# Patient Record
Sex: Female | Born: 1960 | Race: Black or African American | Hispanic: No | Marital: Married | Smoking: Never smoker
Health system: Southern US, Community
[De-identification: ages and names within clinical notes are randomized; demographics above are authoritative.]

## PROBLEM LIST (undated history)

## (undated) DIAGNOSIS — N63 Unspecified lump in unspecified breast: Secondary | ICD-10-CM

## (undated) DIAGNOSIS — L709 Acne, unspecified: Secondary | ICD-10-CM

## (undated) DIAGNOSIS — L0291 Cutaneous abscess, unspecified: Secondary | ICD-10-CM

## (undated) DIAGNOSIS — D219 Benign neoplasm of connective and other soft tissue, unspecified: Secondary | ICD-10-CM

## (undated) DIAGNOSIS — M25471 Effusion, right ankle: Secondary | ICD-10-CM

## (undated) DIAGNOSIS — Z87440 Personal history of urinary (tract) infections: Secondary | ICD-10-CM

## (undated) DIAGNOSIS — D250 Submucous leiomyoma of uterus: Secondary | ICD-10-CM

## (undated) DIAGNOSIS — R011 Cardiac murmur, unspecified: Secondary | ICD-10-CM

## (undated) DIAGNOSIS — D649 Anemia, unspecified: Secondary | ICD-10-CM

## (undated) DIAGNOSIS — Z8742 Personal history of other diseases of the female genital tract: Secondary | ICD-10-CM

## (undated) DIAGNOSIS — M2550 Pain in unspecified joint: Secondary | ICD-10-CM

## (undated) DIAGNOSIS — N611 Abscess of the breast and nipple: Secondary | ICD-10-CM

## (undated) DIAGNOSIS — M25475 Effusion, left foot: Secondary | ICD-10-CM

## (undated) DIAGNOSIS — E669 Obesity, unspecified: Secondary | ICD-10-CM

## (undated) DIAGNOSIS — M25474 Effusion, right foot: Secondary | ICD-10-CM

## (undated) DIAGNOSIS — K5900 Constipation, unspecified: Secondary | ICD-10-CM

## (undated) DIAGNOSIS — L0390 Cellulitis, unspecified: Secondary | ICD-10-CM

## (undated) HISTORY — DX: Obesity, unspecified: E66.9

## (undated) HISTORY — DX: Cellulitis, unspecified: L03.90

## (undated) HISTORY — DX: Personal history of other diseases of the female genital tract: Z87.42

## (undated) HISTORY — DX: Effusion, right foot: M25.474

## (undated) HISTORY — DX: Cardiac murmur, unspecified: R01.1

## (undated) HISTORY — DX: Pain in unspecified joint: M25.50

## (undated) HISTORY — DX: Acne, unspecified: L70.9

## (undated) HISTORY — DX: Cutaneous abscess, unspecified: L02.91

## (undated) HISTORY — DX: Unspecified lump in unspecified breast: N63.0

## (undated) HISTORY — DX: Personal history of urinary (tract) infections: Z87.440

## (undated) HISTORY — DX: Effusion, left foot: M25.475

## (undated) HISTORY — DX: Anemia, unspecified: D64.9

## (undated) HISTORY — DX: Constipation, unspecified: K59.00

## (undated) HISTORY — PX: ABDOMINAL HYSTERECTOMY: SHX81

## (undated) HISTORY — DX: Abscess of the breast and nipple: N61.1

## (undated) HISTORY — DX: Effusion, right ankle: M25.471

## (undated) HISTORY — DX: Submucous leiomyoma of uterus: D25.0

## (undated) HISTORY — PX: TUBAL LIGATION: SHX77

## (undated) HISTORY — DX: Benign neoplasm of connective and other soft tissue, unspecified: D21.9

---

## 1997-12-02 ENCOUNTER — Ambulatory Visit (HOSPITAL_COMMUNITY): Admission: RE | Admit: 1997-12-02 | Discharge: 1997-12-02 | Payer: Self-pay | Admitting: Obstetrics and Gynecology

## 1998-02-11 ENCOUNTER — Other Ambulatory Visit: Admission: RE | Admit: 1998-02-11 | Discharge: 1998-02-11 | Payer: Self-pay | Admitting: Obstetrics and Gynecology

## 1998-03-03 ENCOUNTER — Other Ambulatory Visit: Admission: RE | Admit: 1998-03-03 | Discharge: 1998-03-03 | Payer: Self-pay | Admitting: Obstetrics & Gynecology

## 1998-03-31 ENCOUNTER — Other Ambulatory Visit: Admission: RE | Admit: 1998-03-31 | Discharge: 1998-03-31 | Payer: Self-pay | Admitting: Obstetrics and Gynecology

## 1998-03-31 ENCOUNTER — Inpatient Hospital Stay (HOSPITAL_COMMUNITY): Admission: AD | Admit: 1998-03-31 | Discharge: 1998-04-04 | Payer: Self-pay | Admitting: Obstetrics and Gynecology

## 1998-04-05 ENCOUNTER — Encounter (HOSPITAL_COMMUNITY): Admission: RE | Admit: 1998-04-05 | Discharge: 1998-07-04 | Payer: Self-pay | Admitting: Obstetrics and Gynecology

## 1998-07-16 ENCOUNTER — Encounter (HOSPITAL_COMMUNITY): Admission: RE | Admit: 1998-07-16 | Discharge: 1998-10-14 | Payer: Self-pay | Admitting: *Deleted

## 1998-10-22 ENCOUNTER — Other Ambulatory Visit: Admission: RE | Admit: 1998-10-22 | Discharge: 1998-10-22 | Payer: Self-pay | Admitting: Obstetrics and Gynecology

## 1999-12-13 ENCOUNTER — Other Ambulatory Visit: Admission: RE | Admit: 1999-12-13 | Discharge: 1999-12-13 | Payer: Self-pay | Admitting: Obstetrics and Gynecology

## 2000-12-13 ENCOUNTER — Other Ambulatory Visit: Admission: RE | Admit: 2000-12-13 | Discharge: 2000-12-13 | Payer: Self-pay | Admitting: Obstetrics and Gynecology

## 2001-10-17 DIAGNOSIS — L0390 Cellulitis, unspecified: Secondary | ICD-10-CM

## 2001-10-17 HISTORY — DX: Cellulitis, unspecified: L03.90

## 2001-12-21 ENCOUNTER — Encounter: Payer: Self-pay | Admitting: Obstetrics and Gynecology

## 2001-12-21 ENCOUNTER — Ambulatory Visit (HOSPITAL_COMMUNITY): Admission: RE | Admit: 2001-12-21 | Discharge: 2001-12-21 | Payer: Self-pay | Admitting: Obstetrics and Gynecology

## 2002-01-02 ENCOUNTER — Other Ambulatory Visit: Admission: RE | Admit: 2002-01-02 | Discharge: 2002-01-02 | Payer: Self-pay | Admitting: Obstetrics and Gynecology

## 2002-05-10 ENCOUNTER — Inpatient Hospital Stay (HOSPITAL_COMMUNITY): Admission: AD | Admit: 2002-05-10 | Discharge: 2002-05-13 | Payer: Self-pay | Admitting: Obstetrics and Gynecology

## 2004-07-27 ENCOUNTER — Other Ambulatory Visit: Admission: RE | Admit: 2004-07-27 | Discharge: 2004-07-27 | Payer: Self-pay | Admitting: Obstetrics and Gynecology

## 2004-09-03 ENCOUNTER — Ambulatory Visit (HOSPITAL_COMMUNITY): Admission: RE | Admit: 2004-09-03 | Discharge: 2004-09-03 | Payer: Self-pay | Admitting: Obstetrics and Gynecology

## 2005-09-22 ENCOUNTER — Other Ambulatory Visit: Admission: RE | Admit: 2005-09-22 | Discharge: 2005-09-22 | Payer: Self-pay | Admitting: Obstetrics and Gynecology

## 2005-10-17 DIAGNOSIS — N611 Abscess of the breast and nipple: Secondary | ICD-10-CM

## 2005-10-17 DIAGNOSIS — L0291 Cutaneous abscess, unspecified: Secondary | ICD-10-CM

## 2005-10-17 HISTORY — DX: Abscess of the breast and nipple: N61.1

## 2005-10-17 HISTORY — DX: Cutaneous abscess, unspecified: L02.91

## 2006-04-10 ENCOUNTER — Ambulatory Visit (HOSPITAL_COMMUNITY): Admission: RE | Admit: 2006-04-10 | Discharge: 2006-04-10 | Payer: Self-pay | Admitting: Obstetrics and Gynecology

## 2006-04-21 ENCOUNTER — Encounter: Admission: RE | Admit: 2006-04-21 | Discharge: 2006-04-21 | Payer: Self-pay | Admitting: Obstetrics and Gynecology

## 2006-08-25 ENCOUNTER — Encounter: Admission: RE | Admit: 2006-08-25 | Discharge: 2006-08-25 | Payer: Self-pay | Admitting: Obstetrics and Gynecology

## 2006-08-28 ENCOUNTER — Encounter: Admission: RE | Admit: 2006-08-28 | Discharge: 2006-08-28 | Payer: Self-pay | Admitting: Obstetrics and Gynecology

## 2006-10-17 DIAGNOSIS — Z8742 Personal history of other diseases of the female genital tract: Secondary | ICD-10-CM

## 2006-10-17 DIAGNOSIS — D250 Submucous leiomyoma of uterus: Secondary | ICD-10-CM

## 2006-10-17 HISTORY — DX: Personal history of other diseases of the female genital tract: Z87.42

## 2006-10-17 HISTORY — DX: Submucous leiomyoma of uterus: D25.0

## 2007-08-22 ENCOUNTER — Encounter (INDEPENDENT_AMBULATORY_CARE_PROVIDER_SITE_OTHER): Payer: Self-pay | Admitting: Obstetrics and Gynecology

## 2007-08-22 ENCOUNTER — Ambulatory Visit (HOSPITAL_COMMUNITY): Admission: RE | Admit: 2007-08-22 | Discharge: 2007-08-22 | Payer: Self-pay | Admitting: Obstetrics and Gynecology

## 2007-11-16 ENCOUNTER — Ambulatory Visit (HOSPITAL_COMMUNITY): Admission: RE | Admit: 2007-11-16 | Discharge: 2007-11-16 | Payer: Self-pay | Admitting: Obstetrics and Gynecology

## 2008-10-17 DIAGNOSIS — E669 Obesity, unspecified: Secondary | ICD-10-CM

## 2008-10-17 HISTORY — DX: Obesity, unspecified: E66.9

## 2008-12-02 ENCOUNTER — Ambulatory Visit (HOSPITAL_BASED_OUTPATIENT_CLINIC_OR_DEPARTMENT_OTHER): Admission: RE | Admit: 2008-12-02 | Discharge: 2008-12-02 | Payer: Self-pay | Admitting: Obstetrics and Gynecology

## 2008-12-02 ENCOUNTER — Ambulatory Visit: Payer: Self-pay | Admitting: Diagnostic Radiology

## 2008-12-10 ENCOUNTER — Encounter: Admission: RE | Admit: 2008-12-10 | Discharge: 2008-12-10 | Payer: Self-pay | Admitting: Obstetrics and Gynecology

## 2009-04-08 ENCOUNTER — Inpatient Hospital Stay (HOSPITAL_COMMUNITY): Admission: RE | Admit: 2009-04-08 | Discharge: 2009-04-10 | Payer: Self-pay | Admitting: Obstetrics and Gynecology

## 2009-04-08 ENCOUNTER — Encounter (INDEPENDENT_AMBULATORY_CARE_PROVIDER_SITE_OTHER): Payer: Self-pay | Admitting: Obstetrics and Gynecology

## 2010-04-05 ENCOUNTER — Ambulatory Visit: Payer: Self-pay | Admitting: Diagnostic Radiology

## 2010-04-05 ENCOUNTER — Ambulatory Visit (HOSPITAL_BASED_OUTPATIENT_CLINIC_OR_DEPARTMENT_OTHER): Admission: RE | Admit: 2010-04-05 | Discharge: 2010-04-05 | Payer: Self-pay | Admitting: Obstetrics and Gynecology

## 2010-11-07 ENCOUNTER — Encounter: Payer: Self-pay | Admitting: Obstetrics and Gynecology

## 2011-01-24 LAB — CBC
MCHC: 33.6 g/dL (ref 30.0–36.0)
MCHC: 33.6 g/dL (ref 30.0–36.0)
MCV: 85.1 fL (ref 78.0–100.0)
MCV: 85.7 fL (ref 78.0–100.0)
RBC: 3.21 MIL/uL — ABNORMAL LOW (ref 3.87–5.11)
RBC: 4.02 MIL/uL (ref 3.87–5.11)
RDW: 15.9 % — ABNORMAL HIGH (ref 11.5–15.5)
WBC: 13.7 10*3/uL — ABNORMAL HIGH (ref 4.0–10.5)
WBC: 6.4 10*3/uL (ref 4.0–10.5)

## 2011-03-01 NOTE — Op Note (Signed)
NAMEABIGAIL, TEALL                 ACCOUNT NO.:  [id number]   MEDICAL RECORD NO.:  [id number]          PATIENT TYPE:  AMB   LOCATION:  SDC                           FACILITY:  [facility]   PHYSICIAN:  Hal Morales, M.D.DATE OF BIRTH:  04-Jun-1961   DATE OF PROCEDURE:  08/22/2007  DATE OF DISCHARGE:                               OPERATIVE REPORT   PREOPERATIVE DIAGNOSES:  Menorrhagia, uterine fibroids, anemia.   POSTOPERATIVE DIAGNOSES:  Menorrhagia, uterine fibroids, anemia.   PROCEDURE:  Hysteroscopy, uterine fibroid resection, endometrial  ablation using NovaSure.   SURGEON:  Hal Morales, M.D.   ANESTHESIA:  General, LMA.   COMPLICATIONS:  None.   FINDINGS:  The uterus contained a 2-3 cm fibroid emanating from the  anterior endometrial cavity.  The remainder of the endometrium appeared  thin.  The tubal ostia were within normal limits.   PROCEDURE:  The patient was taken to the operating room after  appropriate identification and placed on the operating table.  After the  attainment of adequate general anesthesia, she was placed in the  lithotomy position.  The perineum and vagina were prepped with multiple  layers of Betadine and a straight catheter used to empty the bladder.  The perineum was draped as a sterile field.  A Graves speculum was  placed in the vagina and a paracervical block achieved with a total of  10 mL of 2% Xylocaine in the 5 and 7 o'clock positions.  The single-  tooth tenaculum was placed on the anterior cervix and the cervical  length measured at 3.5 cm.  The uterus was measured at 9 cm.  The cervix  was dilated to a #33-French to accommodate the operative hysteroscope.  The hysteroscope was placed in the endometrial cavity and the above-  noted findings made and documented.  The VersaPoint apparatus was used  to remove a sample of the fibroid for pathologic diagnosis.  The  VersaPoint roll bar was then attached to the operating channel of the  hysteroscope and was used to successively vaporize the entire uterine  fibroid until the uterine cavity could be completely visualized.  At  that point the hysteroscope was removed and the endometrial cavity  sharply curetted in all quadrants.  The NovaSure endometrial ablation  apparatus was then set at 5.5 and placed into the endometrial cavity.  The array was then opened and seated for a maximum cavity width of 4.5  cm.  The collar of the NovaSure was then pushed toward the cervix and  Vaseline gauze used to completely seal the cervical opening.  The test  for cavity integrity was undertaken and passed.  The NovaSure ablation  was then begun at a power of 136.  The procedure was completed in 53  seconds.  The the Novasure array was then replaced in the carrier and  the carrier removed from the endometrial cavity.  All instruments were  then removed from the vagina and a suture of 2-0 chromic used to achieve  hemostasis in the cervix at the site of the tenaculum placement.  Hemostasis was then noted  to be adequate and the patient was awakened  from general anesthesia and taken to the  recovery room in satisfactory condition, having tolerated the procedure  well, with sponge and instrument counts correct.   SPECIMENS TO PATHOLOGY:  Portion of fibroid and endometrial curettings.      Hal Morales, M.D.  Electronically Signed     VPH/MEDQ  D:  08/22/2007  T:  08/23/2007  Job:  [id number]

## 2011-03-01 NOTE — H&P (Signed)
NAMEAARINI, [name]                 ACCOUNT NO.:  [id number]   MEDICAL RECORD NO.:  [id number]          PATIENT TYPE:  AMB   LOCATION:  SDC                           [facility]ACILITY:  [facility]   PHYSICIAN:  Hal Morales, M.D.DATE OF BIRTH:  Mar 08, 1961   DATE OF ADMISSION:  08/22/2007  DATE OF DISCHARGE:                              HISTORY & PHYSICAL   HISTORY OF THE PRESENT ILLNESS:  The patient is a 50 year old black  married female, para 2-0-0-2, who presents for management of uterine  fibroids, menorrhagia, and subsequent anemia.  The patient had her usual  regular periods __________ heavy until May of 2008 at which time she  noted that her menses were as close together as every 2 weeks and could  last as much as 10 days.  She underwent a TSH which was within normal  limits.  She then underwent a sonohysterogram which showed uterine  fibroids with a submucosal fibroid measuring 2.5 x 2.4.  There also  seemed to be appearance of a hyperechoic mass suggestive of a polyp  which is about 2.3 cm.  At that time, options for management included  hysterectomy and hysteroscopic resection of the submucosal lesions with  or without endometrial ablation.  The patient opted to undergo  hysteroscopy, fibroid resection, and endometrial ablation.   PAST MEDICAL HISTORY:  Negative.   OBSTETRICAL HISTORY:  The patient had a cesarean section in 1999 at 36  weeks' gestation complicated by preeclampsia.  She had a cesarean  section in 2003 with tubal ligation at the same time.  Her postoperative  course was complicated by incisional cellulitis which was managed with  antibiotics.   SURGICAL HISTORY:  As above.   FAMILY HISTORY:  Positive for hypertension and diabetes.   CURRENT MEDICATIONS:  None.   DRUG SENSITIVITIES:  NO KNOWN DRUG ALLERGIES.   REVIEW OF SYSTEMS:  Negative except for the aforementioned changes in  menstrual bleeding.  The patient specifically denied syncope or  dizziness and had  no orthostatic changes in her pulse or blood pressure.   PHYSICAL EXAMINATION:  The patient is a well-developed, black female in  no acute distress.  Temperature 98.7.  Blood pressure 140/88.  Weight  170 pounds.  LUNGS:  Clear.  HEART:  Regular rate and rhythm.  ABDOMEN:  Soft without masses or organomegaly.  PELVIC:  __________ within normal limits.  The vagina is __________ .  The cervix is __________ .  The uterus is upper limits of normal size,  mobile, and nontender.  Adnexa, no masses.   IMPRESSION:  1. Menorrhagia.  2. Uterine fibroids which appear submucosal on sonohysterogram with a      suggestion of another submucosal intracavitary lesion.  3. History of anemia, now on iron.   DISPOSITION:  The patient is to undergo hysteroscopic resection of the  intracavitary masses at [facility] as an outpatient.  She likewise  wishes to proceed with endometrial ablation.  The risks of anesthesia,  bleeding, infection, damage to adjacent organs, and uterine perforation  were all explained to the patient and she seems to  understand.  She  wishes to proceed.  This is to be done at [facility] on August 22, 2007.      Hal Morales, M.D.  Electronically Signed     VPH/MEDQ  D:  08/22/2007  T:  08/23/2007  Job:  [id number]

## 2011-03-01 NOTE — H&P (Signed)
NAMECATHARINE, [name]          ACCOUNT NO.:  [id number]   MEDICAL RECORD NO.:  [id number]           PATIENT TYPE:  INP   LOCATION:  9303                          [facility]ACILITY:  [facility]   PHYSICIAN:  Hal Morales, M.D.DATE OF BIRTH:  May 23, 1961   DATE OF ADMISSION:  04/08/2009  DATE OF DISCHARGE:                              HISTORY & PHYSICAL   HISTORY OF PRESENT ILLNESS:  [name] is a 50 year old married  African American female para 1-1-0-2 presenting for a laparoscopically-  assisted vaginal hysterectomy because of symptomatic uterine fibroids,  menorrhagia, and anemia.  For the past 2 years, the patient reports  heavy menstrual periods, which could occur as often as every 2 weeks and  last for 2 days.  During these episodes she would change an overnight  pad approximately 6 times a day due to the fact she had severe clotting  and occasionally would soil her clothing.  The patient had menstrual  cramps, which she rated as an 8/10 on a 10-point pain scale.  However,  it would totally resolve with the dosing of 2 tablets of Aleve.  The  patient denied any changes in her bowel movements, urinary tract  symptoms, dyspareunia, fever, vaginitis symptoms, or intermenstrual  bleeding.  A pelvic ultrasound and sonohysterogram done in 2008, showed  a uterus measuring 8.58 x 5.70 x 6.06 cm, any submucosal fibroid  measuring 2.58 x 2.4 cm, polyps measuring 2.39 x 0.98 cm.  Additionally,  the patient had an anterior pedunculated fibroid on the lower uterine  segment measuring 3.99 x 3.47 x 3.68 cm and anterior lower uterine  segment fibroid measuring 2.77 x 1.82 x 2.40 cm.  Subsequent to these  studies in November 2008, the patient underwent hysteroscopic fibroid  resection followed by endometrial ablation using the NovaSure system.  In the 13 months that followed the patient states that her menstrual  flow only lasted for 7 days with 1 day being heavy; however, not as  heavy as her  previous menses had been.  In December 2009, the patient's  hemoglobin was 11.  For the past 6 months, the patient states that her  menstrual flow has once again become very heavy lasted for approximately  10 days and requiring the change of an overnight pad (with clotting) 6  times per day.  Given the dramatic recurrence and disruptive nature of  her menstrual flow, the patient has decided to proceed with definitive  therapy for management in the form of hysterectomy.   PAST MEDICAL HISTORY:  OB history gravida 2, para 1-1-0-2, the patient  underwent cesarean section on two occasions.  GYN history, menarche 50  years old.  Last menstrual period, Mar 14, 2009.  The patient uses  bilateral tubal ligation as a method of contraception.  Denies any  history of abnormal Pap smear or sexually transmitted diseases.  Her  last normal Pap smear was December 2009.   MEDICAL HISTORY:  Positive for anemia, chest wall abscess,  pyelonephritis, vitamin D deficiency (resolved May 2009), and cystic  acne.   SURGICAL HISTORY:  In 2003, bilateral tubal ligation; 2008,  hysteroscopic resection  of submucosal fibroid; and NovaSure endometrial  ablation.  The patient denies any problems with anesthesia or history of  blood transfusion.   FAMILY HISTORY:  Positive for hypertension, diabetes, brain cancer.   HABITS:  The patient does not use tobacco, alcohol, illicit drug use.   SOCIAL HISTORY:  The patient is married and she works as a Advertising account executive.   MEDICATIONS:  Septra DS 1 tablet twice daily, iron 65 mg daily,  multivitamin daily, vitamin E daily.   ALLERGIES:  The patient has no known drug allergies.  Denies any  sensitivities to latex or shellfish.   REVIEW OF SYSTEMS:  The patient denies any chest pain, shortness of  breath, vision changes, nausea, vomiting, diarrhea, recent weight loss,  night sweats, pelvic pain, fever, and except as is mentioned in history  of present  illness the patient's review of systems is otherwise  negative.   PHYSICAL EXAMINATION:  VITAL SIGNS:  Blood pressure 140/80, pulse was  82, respirations of 15, temperature of 98.6 degrees Fahrenheit orally.  Weight 174 pounds, height 5 feet 2 inches tall.  Body mass index 31.8.  GENERAL:  Neck is supple without masses.  There is no thyromegaly or  cervical adenopathy.  HEART:  Regular rate and rhythm.  LUNGS:  Clear.  BACK:  No CVA tenderness.  ABDOMEN:  No tenderness, masses, or organomegaly.  EXTREMITIES:  No clubbing, cyanosis, or edema.  PELVIC:  EGBUS was within normal limits.  Vagina was rugous.  Cervix was  nontender without lesions.  Uterus sound to 9 cm.  The uterus itself was  8-10 weeks' size and irregular without any tenderness though note that  the patient's exam was limited by body habitus and the patient's  anxiety.  Adnexa was without tenderness or masses.  Rectovaginal without  masses.  The patient's endometrial biopsy (April 02, 2009) revealed  benign early secretory endometrium consistent with postovulatory days 2-  3.  No hyperplasia, atypia, or malignancy identified.   IMPRESSION:  1. Menorrhagia.  2. Symptomatic uterine fibroids.  3. Anemia.   DISPOSITION:  A discussion was held with the patient regarding the  indications for her procedures along with its risks, which include but  are not limited to reaction to anesthesia, damage to adjacent organs,  infection, excessive bleeding, and the possibility that an open  abdominal incision may be necessary to complete her surgery safely.  Additionally, the patient was advised that her ovaries will remain  unless they appear diseased at which time they will be removed.  The  patient verbalized understanding of these risks and has consented to  proceed with laparoscopically-assisted vaginal hysterectomy with the  possibility of a total abdominal hysterectomy at [facility] of  [place] on April 08, 2009, at 11  o'clock a.m.      [name].      Hal Morales, M.D.  Electronically Signed    EJP/MEDQ  D:  04/06/2009  T:  04/07/2009  Job:  [id number]

## 2011-03-01 NOTE — H&P (Signed)
NAMEELVENA, OYER                 ACCOUNT NO.:  [id number]   MEDICAL RECORD NO.:  [id number]          PATIENT TYPE:  AMB   LOCATION:  SDC                           [facility]   PHYSICIAN:  Hal Morales, M.D.DATE OF BIRTH:  07/21/1961   DATE OF ADMISSION:  DATE OF DISCHARGE:                              HISTORY & PHYSICAL   HISTORY OF THE PRESENT ILLNESS:  The patient is a 50 year old black  married female, para 2-0-0-2, who presents for management of menorrhagia  and uterine fibroids.  The patient had a long history of heavy menses,  however, in May of this year, began having menses as much as every 2  weeks, bleeding as much as 10 days in succession.  She remained  hemodynamically stable; however, had documented anemia to a hemoglobin  of 9 while taking iron.  She underwent a TSH, which was within normal  limits.  She then underwent a pelvic ultrasound and sonohysterogram,  which showed evidence of a submucosal fibroid measuring 2.5 x 2.4, as  well as a hyperechoic mass suggestive of an endometrial polyp measuring  approximately 2.4 cm.  The patient presents for management of this.   PAST MEDICAL HISTORY:  Essentially negative.   OBSTETRICAL HISTORY:  The patient has had 2   DICTATION ENDED AT THIS POINT.      Hal Morales, M.D.  Electronically Signed     VPH/MEDQ  D:  08/22/2007  T:  08/22/2007  Job:  [id number]

## 2011-03-01 NOTE — Op Note (Signed)
NAMEMAXINE, FREDMAN                 ACCOUNT NO.:  [id number]   MEDICAL RECORD NO.:  [id number]          PATIENT TYPE:  INP   LOCATION:  9303                          [facility]ACILITY[facility]   PHYSICIAN:  Hal Morales, M.D.DATE OF BIRTH:  1961-01-03   DATE OF PROCEDURE:  04/08/2009  DATE OF DISCHARGE:                               OPERATIVE REPORT   PREOPERATIVE DIAGNOSES:  1. Symptomatic uterine fibroids.  2. Menometrorrhagia.  3. Anemia.   POSTOPERATIVE DIAGNOSES:  1. Symptomatic uterine fibroids.  2. Menometrorrhagia.  3. Anemia.  4. Endometriosis.  5. Pelvic adhesions.   PROCEDURE:  Laparoscopy, total abdominal hysterectomy with lysis of  adhesions.   SURGEON:  Vanessa P. Pennie Rushing, MD   FIRST ASSISTANT:  Elmira J. Lowell Guitar, PA   ANESTHESIA:  General orotracheal.   ESTIMATED BLOOD LOSS:  800 mL.   COMPLICATIONS:  None.   FINDINGS:  The uterus was enlarged with multiple myomata.  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  The left  ovary was densely adherent to the posterior uterus with stigmata of  powder-burn lesions.  The right ovary was within normal limits.  The  tubes were status post interruption for tubal sterilization.  The  bladder flap status post cesarean section was densely adherent to the  anterior uterus.   PROCEDURE:  The patient was taken to the operating room after  appropriate identification and placed on the operating table.  After the  attainment of adequate general anesthesia, she was placed in the  modified lithotomy position.  The abdomen, perineum, and vagina were all  prepped with multiple layers of Betadine.  A Hulka tenaculum was placed  on the anterior cervix.  A Foley catheter was inserted into the bladder  and connected to straight drainage.  The abdomen and perineum were  draped as a sterile field.  Subumbilical and suprapubic injections of  0.25% Marcaine for a total of 10 mL was undertaken.  Subumbilical  incision was made, and a Veress cannula placed through that incision  into the peritoneal cavity.  Pneumoperitoneum was created with 3 L of  CO2.  The Veress cannula was removed, and the laparoscopic trocar placed  through that incision into the peritoneal cavity.  The laparoscope was  placed in the trocar sleeve.  Suprapubic incisions were made to the  right and left of midline, and laparoscopic probe and trocars of 5 mm  were placed through those incisions into the peritoneal cavity under  direct visualization.  The above-noted findings were made and  documented.  A decision was made to proceed with abdominal hysterectomy.  The instruments were all removed from the peritoneal cavity under direct  visualization as the CO2 was allowed to escape.  The subumbilical  incision was closed with a fascial suture of 0 Vicryl in a figure-of-  eight pattern.  The skin incision was closed with a subcuticular suture  of 3-0 Vicryl.  The suprapubic incisions were incorporated into the skin  incision for the hysterectomy.  The abdomen was opened in layers.  The  peritoneum was  entered, and a self-retaining O'Connor-O'Sullivan  retractor placed in the incision.  The bowel was packed cephalad.  The  uterus was grasped at each cornual region with a Kelly clamp and  elevated to the extent allowed given the degree of adhesive disease.  The right round ligament was then suture ligated and incised and that  incision taken on the anterior leaf of the broad ligament.  It was also  taken caudad to meet the utero-ovarian ligament which was clamped, cut,  tied with free tie and suture ligated.  The left round ligament was  suture ligated and incised and that incision taken anteriorly on the  anterior leaf of the broad ligament which overlay the retroperitoneal  fibroids.  The incision in the broad ligament was taken toward the  bladder and met the incision from the other side.  The bladder was  sharply dissected  off the anterior cervix.  The left utero-ovarian  ligament was densely adherent to the posterior uterus as was the left  ovary.  A combination of blunt and sharp dissection were used to free  the ovary on that side such that the utero-ovarian ligament could be  clamped, cut, tied with a free tie and suture ligated.  The fallopian  tube that overlay the left ovary was then clamped, cut, and excised with  a suture ligature of the base.  A similar procedure was carried out with  the tube that was adherent to the right ovary.  The tubes were removed  from the operative field.  The left uterine fibroid was then dissected  from its retroperitoneal position with a combination of blunt and sharp  dissection, and upon elevation of that fibroid the left uterine artery  was visualized.  It was clamped, cut, and suture ligated.  The uterine  artery on the right was likewise clamped, cut, and suture ligated.  The  parametrial wall and paracervical tissues on the right and left were  successively clamped, cut, and suture ligated.  The uterosacral  ligaments were clamped, cut, and suture ligated with those sutures being  held.  The vaginal angles were then clamped, clamped, cut, and suture  ligated with those sutures held.  The remainder of the uterus and cervix  were excised from the upper vagina and removed from the operative field.  The vaginal cuff was closed with a figure-of-eight suture.  All sutures  to this point were 0 Vicryl.  The left pelvic sidewall had areas of  oozing where the left retroperitoneal fibroid had been dissected.  The  ureter on that side was identified and noted to have overlying  peritoneum with powder-burn lesion.  The bleeders on that side were  incorporated in a single suture from the peritoneum closest to the  ureter to the peritoneum that had previously overlying the round  ligament.  By closing this space and with pressure, hemostasis was  achieved in that area.   Copious irrigation was then carried out, and  hemostasis noted to be adequate.  All instruments were then removed from  the peritoneal cavity, and the abdominal peritoneum closed with a  running suture of 2-0 Vicryl.  The rectus muscles were made hemostatic  with Bovie cautery and irrigated.  The rectus fascia was closed from  each apex to the midline and tied in the midline with a running suture  of 0 Vicryl.  The subcutaneous tissue was made hemostatic with Bovie  cautery and irrigated.  Skin staples were applied to the skin  incision.  A dressing was applied, and the patient was awakened from general  anesthesia, then taken to the recovery room in satisfactory condition  having tolerated the procedure well with sponge and instrument counts  correct.   SPECIMENS TO PATHOLOGY:  Uterus, cervix, right and left fallopian tubes.      Hal Morales, M.D.  Electronically Signed     VPH/MEDQ  D:  04/08/2009  T:  04/09/2009  Job:  [id number]

## 2011-03-04 NOTE — Discharge Summary (Signed)
[name]                 ACCOUNT NO.:  [id number]   MEDICAL RECORD NO.:  [id number]          PATIENT TYPE:  INP   LOCATION:  9303                          FACILITY:  [facility]   PHYSICIAN:  Hal Morales, M.D.DATE OF BIRTH:  [birth year]   DATE OF ADMISSION:  04/08/2009  DATE OF DISCHARGE:  04/10/2009                               DISCHARGE SUMMARY   DISCHARGE DIAGNOSES:  Symptomatic uterine fibroids, menometrorrhagia,  anemia, endometriosis, and pelvic adhesions.   OPERATION ON THE DAY OF ADMISSION:  The patient underwent a diagnostic  laparoscopy followed by a total abdominal hysterectomy with bilateral  salpingectomy and lysis of adhesions.  The patient was found to have a  uterus, which was enlarged with multiple myomata.  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  The left ovary was  densely adherent to the posterior uterus, it was stigmata, and powder  burn lesions.  The right ovary was within normal limits.  The tubes were  status post interruption for tubal sterilization.  The bladder flap,  status post cesarean section, was densely adherent to the anterior  uterus.   HISTORY OF PRESENT ILLNESS:  [name] is a 50 year old married  African American female, para 1-1-0-2, who presented for definitive  therapy of symptomatic uterine fibroids, menorrhagia, and anemia.  Please see the patient's dictated history and physical examination for  details.   PREOPERATIVE PHYSICAL EXAMINATION:  VITA SIGNS:  Blood pressure was  140/80, pulse was 82, respirations 15, temperature 98.6 degrees  Fahrenheit orally, weight 174 pounds, height 5 feet 2 inches, and body  mass index 31.8.  GENERAL:  Within normal limits.  PELVIC:  EGBUS was within normal limits.  Vagina was rugous.  Cervix was  nontender without lesions.  Uterus was 8-10 weeks' size and irregular  without any tenderness though it was noted that the patient's exam was  limited by body  habitus and the patient's anxiety.  The patient's adnexa  was without tenderness or masses and rectovaginal was without tenderness  or masses.   HOSPITAL COURSE ON THE DAY OF ADMISSION:  The patient underwent  aforementioned procedures, tolerating them all well.  Postoperative  course was unremarkable with the patient having minimal symptoms to her  postop hemoglobin of 9.3 (preop hemoglobin 11.5).  By postop day #2, the  patient had resumed bowel and bladder function, was not orthostatic, and  was therefore deemed ready for discharge home.   DISCHARGE MEDICATIONS:  1. Colace 100 mg twice daily until bowel movements are regular.  2. Percocet 5/325 1-2 tablets every 4 hours as needed for pain.  3. Ibuprofen 600 mg with food every 6 hours for 5 days, then as needed      for pain.   FOLLOWUP:  The patient has an appointment at [facility] for  staple removal on April 16, 2009 at 2:45 p.m.  She also has a 6 weeks  postoperative visit with Dr. Pennie Rushing on May 20, 2009 at 10:30 a.m.   DISCHARGE INSTRUCTIONS:  The patient was given a copy of  [facility] postoperative instruction sheet.  She was further  advised to avoid driving for 2 weeks, heavy lifting for 6 weeks,  intercourse for 6 weeks, then she may walk up stairs, and she may  shower.  The patient's diet was without restriction.  Wound care is to  keep incision clean and dry.   FINAL PATHOLOGY:  Uterus and bilateral fallopian tubes, hysterectomy and  bilateral salpingectomy:  Leiomyoma; endometrium - benign secretory  endometrium, no atypia, hyperplasia, or malignancy identified.  Cervix -  benign squamous mucosa and endocervix.  No dysplasia or malignancy;  bilateral fallopian tubes:  No pathologic abnormalities.      [name].      Hal Morales, M.D.  Electronically Signed    EJP/MEDQ  D:  04/25/2009  T:  04/26/2009  Job:  [id number]

## 2011-07-26 LAB — CBC
Platelets: 311
RBC: 4.24
WBC: 7.5

## 2012-01-11 ENCOUNTER — Ambulatory Visit: Payer: Self-pay | Admitting: Obstetrics and Gynecology

## 2012-03-27 ENCOUNTER — Ambulatory Visit: Payer: Self-pay | Admitting: Obstetrics and Gynecology

## 2012-05-21 ENCOUNTER — Ambulatory Visit: Payer: Self-pay | Admitting: Obstetrics and Gynecology

## 2012-05-24 ENCOUNTER — Ambulatory Visit: Payer: Self-pay | Admitting: Obstetrics and Gynecology

## 2012-05-24 ENCOUNTER — Encounter: Payer: Self-pay | Admitting: Obstetrics and Gynecology

## 2012-05-24 ENCOUNTER — Ambulatory Visit (INDEPENDENT_AMBULATORY_CARE_PROVIDER_SITE_OTHER): Payer: BC Managed Care – PPO | Admitting: Obstetrics and Gynecology

## 2012-05-24 VITALS — BP 142/72 | Ht 63.0 in | Wt 181.0 lb

## 2012-05-24 DIAGNOSIS — Z8742 Personal history of other diseases of the female genital tract: Secondary | ICD-10-CM

## 2012-05-24 DIAGNOSIS — L0390 Cellulitis, unspecified: Secondary | ICD-10-CM

## 2012-05-24 DIAGNOSIS — Z87440 Personal history of urinary (tract) infections: Secondary | ICD-10-CM | POA: Insufficient documentation

## 2012-05-24 DIAGNOSIS — L0291 Cutaneous abscess, unspecified: Secondary | ICD-10-CM

## 2012-05-24 DIAGNOSIS — E669 Obesity, unspecified: Secondary | ICD-10-CM

## 2012-05-24 DIAGNOSIS — N63 Unspecified lump in unspecified breast: Secondary | ICD-10-CM | POA: Insufficient documentation

## 2012-05-24 DIAGNOSIS — D250 Submucous leiomyoma of uterus: Secondary | ICD-10-CM | POA: Insufficient documentation

## 2012-05-24 DIAGNOSIS — D649 Anemia, unspecified: Secondary | ICD-10-CM

## 2012-05-24 DIAGNOSIS — N611 Abscess of the breast and nipple: Secondary | ICD-10-CM | POA: Insufficient documentation

## 2012-05-24 DIAGNOSIS — D219 Benign neoplasm of connective and other soft tissue, unspecified: Secondary | ICD-10-CM | POA: Insufficient documentation

## 2012-05-24 LAB — HEMOGLOBIN: Hemoglobin: 12.7 g/dL (ref 12.0–15.0)

## 2012-05-24 NOTE — Progress Notes (Signed)
AEX  Last Pap: 10/08/2008 WNL: Yes Regular Periods:no Contraception: Hysterectomy  Monthly Breast exam:yes Tetanus<33yrs: unsure Nl.Bladder Function:yes Daily BMs:yes Healthy Diet:yes Calcium:no Mammogram:yes Date of Mammogram: 03/2011 Exercise:yes Have often Exercise: 1-2 times weekly Seatbelt: yes Abuse at home: no Stressful work:no Sigmoid-colonoscopy: 2006 Bone Density: No PCP: None Change in PMH: None Change in [facility]: None Subjective:    [name] is a 51 y.o. female [id number] who presents for annual exam.  The patient has no complaints today. She is s/p hsyterectomy. Now caring for her mom with early dementia age 46  The following portions of the patient's history were reviewed and updated as appropriate: allergies, current medications, past family history, past medical history, past social history, past surgical history and problem list.  Review of Systems Pertinent items are noted in HPI. Gastrointestinal:No change in bowel habits, no abdominal pain, no rectal bleeding Genitourinary:negative for dysuria, frequency, hematuria, nocturia and urinary incontinence    Objective:     BP 142/72  Ht 5\' 3"  (1.6 m)  Wt 181 lb (82.101 kg)  BMI 32.06 kg/m2  Weight:  Wt Readings from Last 1 Encounters:  05/24/12 181 lb (82.101 kg)     BMI: Body mass index is 32.06 kg/(m^2). General Appearance: Alert, appropriate appearance for age. No acute distress HEENT: Grossly normal Neck / Thyroid: Supple, no masses, nodes or enlargement Lungs: clear to auscultation bilaterally Back: No CVA tenderness Breast Exam: No masses or nodes.No dimpling, nipple retraction or discharge. Cardiovascular: Regular rate and rhythm. S1, S2, no murmur Gastrointestinal: Soft, non-tender, no masses or organomegaly Pelvic Exam: External genitalia: normal general appearance Vaginal: normal mucosa without prolapse or lesions and vaginal vault, well suspended and well healed Cervix: removed  surgically Adnexa: normal bimanual exam Uterus: removed surgically Rectovaginal: normal rectal, no masses Lymphatic Exam: Non-palpable nodes in neck, clavicular, axillary, or inguinal regions  Skin: no rash or abnormalities Neurologic: Normal gait and speech, no tremor  Psychiatric: Alert and oriented, appropriate affect.    Urinalysis:Not done      Assessment:    normal post hysterectomy exam    Plan:    Discussed healthy lifestyle modifications.  Follow-up:  for annual exam

## 2012-07-10 ENCOUNTER — Telehealth: Payer: Self-pay

## 2012-07-10 NOTE — Telephone Encounter (Signed)
The requested letter with the wording as outlined is approved.

## 2012-07-10 NOTE — Telephone Encounter (Signed)
Tc from pt. Pt wants vph to know that she been under the care of [facility] due to "superficial vein discomfort in legs". Pt is moving forth with tx by injection. Pt needs vph to write a letter for ins purposes stating,"Dr. Pennie Rushing is aware of this assessment(as her PCP per pt) and aware pt is under the care of [facility] in order to proceed with tx. Informed pt to have [facility] fax [place] recent office notes rgdg this. Will consult with vph rgdg letter after review of records. Pt voices understanding.

## 2012-07-11 NOTE — Telephone Encounter (Signed)
Letter faxed to Dr. Ardyth Gal per pt's req [phone]. Pt agrees.

## 2013-10-01 ENCOUNTER — Ambulatory Visit: Payer: Self-pay | Admitting: Dietician

## 2013-11-13 ENCOUNTER — Encounter: Payer: Self-pay | Admitting: *Deleted

## 2013-11-13 ENCOUNTER — Encounter: Payer: BC Managed Care – PPO | Attending: Obstetrics and Gynecology | Admitting: *Deleted

## 2013-11-13 DIAGNOSIS — Z8249 Family history of ischemic heart disease and other diseases of the circulatory system: Secondary | ICD-10-CM | POA: Insufficient documentation

## 2013-11-13 DIAGNOSIS — Z833 Family history of diabetes mellitus: Secondary | ICD-10-CM | POA: Insufficient documentation

## 2013-11-13 DIAGNOSIS — E669 Obesity, unspecified: Secondary | ICD-10-CM

## 2013-11-13 DIAGNOSIS — Z713 Dietary counseling and surveillance: Secondary | ICD-10-CM | POA: Insufficient documentation

## 2013-11-13 NOTE — Progress Notes (Signed)
Medical Nutrition Therapy:  Appt start time: 1700 end time:  1800.  Assessment:  Primary concerns today: [name] is here for nutrition counseling.  She would like to lose a little weight and prevent HTN, hyperlipidemia, diabetes, etc.  She would like to learn more about healthy eating and how to reduce sugar in her diet.  There is a strong family history of DM, HTN, etc and she wants to prevent that.  She has been working with a health couch monthly to help keep her encouraged and accountable.  She lives with her husband, mother, and 2 sons.  She does the food shopping and shares responsibility of cooking with her husband.  She reports mostly baking foods.  She eats out pretty often: 3 times a week: [employer] salad or chili.  She is trying to be more health conscious for the past 3-6 months.  Sometimes they get [place].  She eats in the kitchen table or in the family room with tv trays while watching tv.  She states she finishes a meal in 10-15 minutes.   Her highest weight as an adult is 178 lb and lowest weight as an adult is 126 lb when in college.  Her most consistent weight is around 145-160.After her second pregnancy she was not able to lose the baby weight and her weight crept up due to lac of activity and unhealthy eating choices.  She would like to weigh around 145 lb  Preferred Learning Style:   Visual    Learning Readiness:   Change in progress   MEDICATIONS: see list   DIETARY INTAKE:  Usual eating pattern includes 2-3 meals and 0-2 snacks per day.  Everyday foods include proteins, vegetables, some starches.  Avoided foods include none.    24-hr recall:  Snk ( AM): yogurt around 9:30 with granola and fruit and sometimes peanuts~ parfait  L ( PM): peanut butter crackers and peaches with water.  Usually salad with ham or [place] or chicken with white cheese, broccoli, carrots, with iceberg lettuce.  May or may not have crutons.  Tries to get [place] dressing and she might get french  dressing.  water Snk ( PM): chocolate bar bite size sometimes  D ( PM): salad with ham or [place] or chicken with white cheese, broccoli, carrots, with iceberg lettuce.  May or may not have crutons.  Tries to get [place] dressing and she might get french dressing.  Baked chicken or fish Snk ( PM): maybe cookie Beverages: water.  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  As evidenced by dietary recall.    Intervention:  Nutrition counseling provided.  Encouraged patient to reject traditional diet mentality of "good" vs "bad" foods.  There are no good and bad foods, but rather food is fuel that we needs for our bodies.  When we don't get enough fuel, our bodies suffer the metabolic consequences.  Encouraged patient to eat whatever foods will satisfy them, regardless of their nutritional value.  We will discuss nutritional values of foods at a subsequent appointment.  Encouraged patient to honor their body's internal hunger and fullness cues.  Throughout the day, check in mentally and rate hunger.  Try not to eat when ravenous, but instead  when slightly hungry.  Then choose food(s) that will be satisfying regardless of nutritional content.  Sit down to enjoy those foods.  Minimize distractions: turn off tv, put away books, work, Brewing technologist.  Make the meal last at least 20 minutes in order to give time to experience and register satiety.  Stop eating when full regardless of how much food is left on the plate.  Get more if still hungry.  The key is to honor fullness so throughout the meal, rate fullness factor and stop when comfortably full, but not stuffed.  Reminded patient that they can have any food they want, whenever they want, and  however much they want.  Eventually the novelty will wear out and each food will be equal in terms of its emotional appeal.  This will be a learning process and some days more food will be eaten, some days less.  The key is to honor hunger and fullness without any feelings of guilt.  Pay attention to what the internal cues are, rather than any external factors.  Goals: B: yogurt with fruit, nuts, and granola; peanut butter toast/English muffin with fruit; oatmeal with fruit; bagel thin; egg muffin with toast and fruit L: salad with low-sodium cracker; hot meal D: salad with crutons or cracker; meat, starch, more veggie :-)  Teaching Method Utilized:  Visual Auditory  Handouts given during visit include:  MyPlate  snacks  Barriers to learning/adherence to lifestyle change: busy schedule  Demonstrated degree of understanding via:  Teach Back   Monitoring/Evaluation:  Dietary intake, exercise, and body weight prn.

## 2013-11-13 NOTE — Patient Instructions (Signed)
B: yogurt with fruit, nuts, and granola; peanut butter toast/English muffin with fruit; oatmeal with fruit; bagel thin; egg muffin with toast and fruit L: salad with low-sodium cracker; hot meal D: salad with crutons or cracker; meat, starch, more veggie :-)

## 2014-08-18 ENCOUNTER — Encounter: Payer: Self-pay | Admitting: *Deleted

## 2016-05-04 DIAGNOSIS — I8393 Asymptomatic varicose veins of bilateral lower extremities: Secondary | ICD-10-CM | POA: Insufficient documentation

## 2016-05-04 DIAGNOSIS — L700 Acne vulgaris: Secondary | ICD-10-CM | POA: Insufficient documentation

## 2016-05-04 DIAGNOSIS — M722 Plantar fascial fibromatosis: Secondary | ICD-10-CM | POA: Insufficient documentation

## 2016-05-04 DIAGNOSIS — I10 Essential (primary) hypertension: Secondary | ICD-10-CM | POA: Insufficient documentation

## 2016-05-25 DIAGNOSIS — R748 Abnormal levels of other serum enzymes: Secondary | ICD-10-CM | POA: Insufficient documentation

## 2018-03-16 DIAGNOSIS — E559 Vitamin D deficiency, unspecified: Secondary | ICD-10-CM | POA: Insufficient documentation

## 2018-03-16 DIAGNOSIS — N951 Menopausal and female climacteric states: Secondary | ICD-10-CM | POA: Insufficient documentation

## 2019-04-30 ENCOUNTER — Other Ambulatory Visit: Payer: Self-pay | Admitting: *Deleted

## 2019-04-30 DIAGNOSIS — Z20822 Contact with and (suspected) exposure to covid-19: Secondary | ICD-10-CM

## 2019-05-05 LAB — NOVEL CORONAVIRUS, NAA: SARS-CoV-2, NAA: NOT DETECTED

## 2019-09-10 ENCOUNTER — Encounter (INDEPENDENT_AMBULATORY_CARE_PROVIDER_SITE_OTHER): Payer: Self-pay | Admitting: Family Medicine

## 2019-09-10 ENCOUNTER — Ambulatory Visit (INDEPENDENT_AMBULATORY_CARE_PROVIDER_SITE_OTHER): Payer: BC Managed Care – PPO | Admitting: Family Medicine

## 2019-09-10 ENCOUNTER — Other Ambulatory Visit: Payer: Self-pay

## 2019-09-10 VITALS — BP 148/76 | HR 66 | Temp 98.2°F | Ht 62.0 in | Wt 184.0 lb

## 2019-09-10 DIAGNOSIS — Z0289 Encounter for other administrative examinations: Secondary | ICD-10-CM

## 2019-09-10 DIAGNOSIS — Z9189 Other specified personal risk factors, not elsewhere classified: Secondary | ICD-10-CM

## 2019-09-10 DIAGNOSIS — G4710 Hypersomnia, unspecified: Secondary | ICD-10-CM

## 2019-09-10 DIAGNOSIS — F3289 Other specified depressive episodes: Secondary | ICD-10-CM | POA: Diagnosis not present

## 2019-09-10 DIAGNOSIS — R5383 Other fatigue: Secondary | ICD-10-CM | POA: Diagnosis not present

## 2019-09-10 DIAGNOSIS — R0602 Shortness of breath: Secondary | ICD-10-CM

## 2019-09-10 DIAGNOSIS — E559 Vitamin D deficiency, unspecified: Secondary | ICD-10-CM

## 2019-09-10 DIAGNOSIS — E669 Obesity, unspecified: Secondary | ICD-10-CM

## 2019-09-10 DIAGNOSIS — I10 Essential (primary) hypertension: Secondary | ICD-10-CM | POA: Diagnosis not present

## 2019-09-10 DIAGNOSIS — Z6833 Body mass index (BMI) 33.0-33.9, adult: Secondary | ICD-10-CM

## 2019-09-10 DIAGNOSIS — L732 Hidradenitis suppurativa: Secondary | ICD-10-CM | POA: Insufficient documentation

## 2019-09-11 LAB — COMPREHENSIVE METABOLIC PANEL
ALT: 13 IU/L (ref 0–32)
AST: 18 IU/L (ref 0–40)
Albumin/Globulin Ratio: 1.7 (ref 1.2–2.2)
Albumin: 4.3 g/dL (ref 3.8–4.9)
Alkaline Phosphatase: 101 IU/L (ref 39–117)
BUN/Creatinine Ratio: 20 (ref 9–23)
BUN: 18 mg/dL (ref 6–24)
Bilirubin Total: 0.2 mg/dL (ref 0.0–1.2)
CO2: 25 mmol/L (ref 20–29)
Calcium: 9.1 mg/dL (ref 8.7–10.2)
Chloride: 104 mmol/L (ref 96–106)
Creatinine, Ser: 0.91 mg/dL (ref 0.57–1.00)
GFR calc Af Amer: 81 mL/min/{1.73_m2} (ref >59)
GFR calc non Af Amer: 70 mL/min/{1.73_m2} (ref >59)
Globulin, Total: 2.6 g/dL (ref 1.5–4.5)
Glucose: 85 mg/dL (ref 65–99)
Potassium: 4.2 mmol/L (ref 3.5–5.2)
Sodium: 141 mmol/L (ref 134–144)
Total Protein: 6.9 g/dL (ref 6.0–8.5)

## 2019-09-11 LAB — LIPID PANEL WITH LDL/HDL RATIO
Cholesterol, Total: 184 mg/dL (ref 100–199)
HDL: 47 mg/dL (ref >39)
LDL Chol Calc (NIH): 121 mg/dL — ABNORMAL HIGH (ref 0–99)
LDL/HDL Ratio: 2.6 ratio (ref 0.0–3.2)
Triglycerides: 88 mg/dL (ref 0–149)
VLDL Cholesterol Cal: 16 mg/dL (ref 5–40)

## 2019-09-11 LAB — CBC WITH DIFFERENTIAL/PLATELET
Basophils Absolute: 0 10*3/uL (ref 0.0–0.2)
Basos: 0 %
EOS (ABSOLUTE): 0.1 10*3/uL (ref 0.0–0.4)
Eos: 1 %
Hematocrit: 37.2 % (ref 34.0–46.6)
Hemoglobin: 12.1 g/dL (ref 11.1–15.9)
Immature Grans (Abs): 0 10*3/uL (ref 0.0–0.1)
Immature Granulocytes: 0 %
Lymphocytes Absolute: 3 10*3/uL (ref 0.7–3.1)
Lymphs: 40 %
MCH: 27.5 pg (ref 26.6–33.0)
MCHC: 32.5 g/dL (ref 31.5–35.7)
MCV: 85 fL (ref 79–97)
Monocytes Absolute: 0.3 10*3/uL (ref 0.1–0.9)
Monocytes: 4 %
Neutrophils Absolute: 4.1 10*3/uL (ref 1.4–7.0)
Neutrophils: 55 %
Platelets: 283 10*3/uL (ref 150–450)
RBC: 4.4 x10E6/uL (ref 3.77–5.28)
RDW: 13.3 % (ref 11.7–15.4)
WBC: 7.5 10*3/uL (ref 3.4–10.8)

## 2019-09-11 LAB — INSULIN, RANDOM: INSULIN: 7.2 u[IU]/mL (ref 2.6–24.9)

## 2019-09-11 LAB — VITAMIN D 25 HYDROXY (VIT D DEFICIENCY, FRACTURES): Vit D, 25-Hydroxy: 38.5 ng/mL (ref 30.0–100.0)

## 2019-09-11 LAB — VITAMIN B12: Vitamin B-12: 1037 pg/mL (ref 232–1245)

## 2019-09-11 LAB — T3: T3, Total: 125 ng/dL (ref 71–180)

## 2019-09-11 LAB — HEMOGLOBIN A1C
Est. average glucose Bld gHb Est-mCnc: 126 mg/dL
Hgb A1c MFr Bld: 6 % — ABNORMAL HIGH (ref 4.8–5.6)

## 2019-09-11 LAB — T4, FREE: Free T4: 1.17 ng/dL (ref 0.82–1.77)

## 2019-09-11 LAB — TSH: TSH: 1.43 u[IU]/mL (ref 0.450–4.500)

## 2019-09-11 NOTE — Progress Notes (Signed)
Office: [phone]  /  Fax: [phone]   HPI:   Chief Complaint: OBESITY  CHINIQUA BLEA (MR# [id number]) is a 58 y.o. female who presents on 09/10/2019 for obesity evaluation and treatment. Current BMI is Body mass index is 33.65 kg/m. Diesha has struggled with obesity for years and has been unsuccessful in either losing weight or maintaining long term weight loss. Kimley states she is currently in the action stage of change and ready to dedicate time achieving and maintaining a healthier weight.   Clarissa states her family eats meals together she thinks her family will eat healthier with  her she struggles with family and or coworkers weight loss sabotage her desired weight loss is 39 lbs or less she started gaining weight after age 52 and hysterectomy her heaviest weight ever was 186 lbs she has significant food cravings issues  she skips meals frequently she is frequently drinking liquids with calories she frequently makes poor food choices she frequently eats larger portions than normal  she struggles with emotional eating    Fatigue Gae feels her energy is lower than it should be. This has worsened with weight gain and has not worsened recently. Queenie admits to daytime somnolence and  denies waking up still tired. Patient is at risk for obstructive sleep apnea. Patent has a history of symptoms of daytime fatigue. Patient generally gets 6 hours of sleep per night, and states they generally have generally restful sleep. Snoring is present. Apneic episodes are not present. Epworth Sleepiness Score is 12.  Dyspnea on exertion Gavin notes increasing shortness of breath with exercising and seems to be worsening over time with weight gain. She notes getting out of breath sooner with activity than she used to. This has not gotten worse recently. Ranie denies orthopnea.  Hypersomnia Daryan has hypersomnia. Her epworth score is 12. She states she wakes up refreshed, but notes occasional  snoring.  Hypertension Arika GENEE FIALKOWSKI is a 58 y.o. female with hypertension. Jossilyn stopped spironolactone 2 weeks ago. She denies chest pain. She is working on weight loss to help control her blood pressure with the goal of decreasing her risk of heart attack and stroke.   Vitamin D Deficiency Cabrini has a diagnosis of vitamin D deficiency. She is currently taking multivitamin gummies daily and denies nausea, vomiting or muscle weakness.  At risk for osteopenia and osteoporosis Bruchy is at higher risk of osteopenia and osteoporosis due to vitamin D deficiency.   Depression with Emotional Eating Behaviors Nnenna is struggling with emotional eating and using food for comfort to the extent that it is negatively impacting her health. She often snacks when she is not hungry. Kayly sometimes feels she is out of control and then feels guilty that she made poor food choices. She has been working on behavior modification techniques to help reduce her emotional eating and has been somewhat successful. She shows no sign of suicidal or homicidal ideations.  Depression Screen Catelin's Food and Mood (modified PHQ-9) score was  Depression screen PHQ 2/9 09/10/2019  Decreased Interest 1  Down, Depressed, Hopeless 1  PHQ - 2 Score 2  Altered sleeping 1  Tired, decreased energy 1  Change in appetite 1  Feeling bad or failure about yourself  0  Trouble concentrating 1  Moving slowly or fidgety/restless 0  Suicidal thoughts 0  PHQ-9 Score 6  Difficult doing work/chores Not difficult at all    ASSESSMENT AND PLAN:  Other fatigue - Plan: EKG 12-Lead, Comprehensive metabolic  panel, CBC with Differential/Platelet, Hemoglobin A1c, Insulin, random, B12, T3, T4, free, TSH  SOB (shortness of breath) on exertion - Plan: Lipid Panel With LDL/HDL Ratio  Hypersomnia  Essential hypertension  Vitamin D deficiency - Plan: Vitamin D (25 hydroxy)  Other depression, with emotional eating   At risk for  osteoporosis  Class 1 obesity with serious comorbidity and body mass index (BMI) of 33.0 to 33.9 in adult, unspecified obesity type  PLAN:  Fatigue Ayaan was informed that her fatigue may be related to obesity, depression or many other causes. Labs will be ordered, and in the meanwhile Oprah has agreed to work on diet, exercise and weight loss to help with fatigue. Proper sleep hygiene was discussed including the need for 7-8 hours of quality sleep each night. A sleep study was not ordered based on symptoms and Epworth score.  Dyspnea on exertion Heavenly's shortness of breath appears to be obesity related and exercise induced. She has agreed to work on weight loss and gradually increase exercise to treat her exercise induced shortness of breath. If Alyanah follows our instructions and loses weight without improvement of her shortness of breath, we will plan to refer to pulmonology. We will monitor this condition regularly. Laya agrees to this plan.  Hypersomnia We will check labs today, and will continue to monitor.  Hypertension We discussed sodium restriction, working on healthy weight loss, and a regular exercise program as the means to achieve improved blood pressure control. Jahnae agreed with this plan and agreed to follow up as directed. We will continue to monitor her blood pressure as well as her progress with the above lifestyle modifications. Willy agrees to restart spironolactone as prescribed, no refill needed. She will watch for signs of hypotension as she continues her lifestyle modifications. Aaliayah agrees to follow up with our clinic in 2 weeks.  Vitamin D Deficiency Delainee was informed that low vitamin D levels contributes to fatigue and are associated with obesity, breast, and colon cancer. She will follow up for routine testing of vitamin D, at least 2-3 times per year. She was informed of the risk of over-replacement of vitamin D and agrees to not increase her dose unless she  discusses this with [place] first. We will check labs today. Edmund agrees to follow up with our clinic in 2 weeks.  At risk for osteopenia and osteoporosis Addilyne was given extended (15 minutes) osteoporosis prevention counseling today. Mayrani is at risk for osteopenia and osteoporsis due to her vitamin D deficiency. She was encouraged to take her vitamin D and follow her higher calcium diet and increase strengthening exercise to help strengthen her bones and decrease her risk of osteopenia and osteoporosis.  Depression with Emotional Eating Behaviors We discussed behavior modification techniques today to help Minelly deal with her emotional eating and depression. We will continue to monitor.  Depression Screen Aeron had a mildly positive depression screening. Depression is commonly associated with obesity and often results in emotional eating behaviors. We will monitor this closely and work on CBT to help improve the non-hunger eating patterns. Referral to Psychology may be required if no improvement is seen as she continues in our clinic.  Obesity Daizee is currently in the action stage of change and her goal is to continue with weight loss efforts She has agreed to follow the Category 2 plan Laia has been instructed to work up to a goal of 150 minutes of combined cardio and strengthening exercise per week or as tolerated for weight  loss and overall health benefits. We discussed the following Behavioral Modification Strategies today: increasing lean protein intake, decreasing simple carbohydrates, increasing vegetables, increase H20 intake, work on meal planning and easy cooking plans and holiday eating strategies   Makailyn has agreed to follow up with our clinic in 2 weeks. She was informed of the importance of frequent follow up visits to maximize her success with intensive lifestyle modifications for her multiple health conditions. She was informed we would discuss her lab results at her next visit  unless there is a critical issue that needs to be addressed sooner. Bryann agreed to keep her next visit at the agreed upon time to discuss these results.  ALLERGIES: No Known Allergies  MEDICATIONS: Current Outpatient Medications on File Prior to Visit  Medication Sig Dispense Refill  . Biotin w/ Vitamins C & E (HAIR/SKIN/NAILS PO) Take by mouth.    . Multiple Vitamins-Minerals (MULTIVITAMIN PO) Take by mouth.    . terbinafine (LAMISIL) 250 MG tablet Take 250 mg by mouth daily.    Marland Kitchen spironolactone (ALDACTONE) 50 MG tablet Take 50 mg by mouth daily.     No current facility-administered medications on file prior to visit.     PAST MEDICAL HISTORY: Past Medical History:  Diagnosis Date  . Abscess of skin 2007  . Acne   . Anemia   . Bilateral swelling of feet and ankles   . Breast abscess 2007  . Breast mass   . Cellulitis 2003  . Constipation   . Fibroid   . Fibroids, submucosal 2008  . H/O: menorrhagia 2008  . Heart murmur   . Hx: UTI (urinary tract infection)   . Joint pain   . Obesity 2010    PAST SURGICAL HISTORY: Past Surgical History:  Procedure Laterality Date  . ABDOMINAL HYSTERECTOMY    . TUBAL LIGATION      SOCIAL HISTORY: Social History   Tobacco Use  . Smoking status: Never Smoker  . Smokeless tobacco: Never Used  Substance Use Topics  . Alcohol use: No  . Drug use: No    FAMILY HISTORY: Family History  Problem Relation Age of Onset  . Hypertension Mother   . Diabetes Mother   . Diabetes Maternal Grandmother   . Hypertension Maternal Grandmother   . Diabetes Maternal Grandfather   . Hypertension Maternal Grandfather   . Cancer Father   . Depression Father     ROS: Review of Systems  Constitutional: Positive for malaise/fatigue. Negative for weight loss.  Eyes:       + Vision changes + Wear glasses or contacts  Respiratory: Positive for shortness of breath (with exertion).   Cardiovascular: Negative for chest pain and orthopnea.    Gastrointestinal: Negative for nausea and vomiting.  Musculoskeletal:       Negative muscle weakness  Skin:       + Dryness (scalp)  Psychiatric/Behavioral: Positive for depression. Negative for suicidal ideas.       + Stress    PHYSICAL EXAM: Blood pressure (!) 148/76, pulse 66, temperature 98.2 F (36.8 C), temperature source Oral, height 5\' 2"  (1.575 m), weight 184 lb (83.5 kg), SpO2 99 %. Body mass index is 33.65 kg/m. Physical Exam Vitals signs reviewed.  Constitutional:      Appearance: Normal appearance. She is obese.  HENT:     Head: Normocephalic and atraumatic.     Nose: Nose normal.  Eyes:     General: No scleral icterus.    Extraocular Movements: Extraocular  movements intact.  Neck:     Musculoskeletal: Normal range of motion and neck supple.     Comments: No thyromegaly present Cardiovascular:     Rate and Rhythm: Normal rate and regular rhythm.     Pulses: Normal pulses.     Heart sounds: Normal heart sounds.  Pulmonary:     Effort: No respiratory distress.     Breath sounds: Normal breath sounds.  Abdominal:     Palpations: Abdomen is soft.     Tenderness: There is no abdominal tenderness.     Comments: + Obesity  Musculoskeletal: Normal range of motion.     Right lower leg: No edema.     Left lower leg: No edema.  Skin:    General: Skin is warm and dry.  Neurological:     Mental Status: She is alert and oriented to person, place, and time.     Coordination: Coordination normal.  Psychiatric:        Mood and Affect: Mood normal.        Behavior: Behavior normal.     RECENT LABS AND TESTS: BMET    Component Value Date/Time   NA 141 09/10/2019 1040   K 4.2 09/10/2019 1040   CL 104 09/10/2019 1040   CO2 25 09/10/2019 1040   GLUCOSE 85 09/10/2019 1040   BUN 18 09/10/2019 1040   CREATININE 0.91 09/10/2019 1040   CALCIUM 9.1 09/10/2019 1040   GFRNONAA 70 09/10/2019 1040   GFRAA 81 09/10/2019 1040   Lab Results  Component Value Date    HGBA1C 6.0 (H) 09/10/2019   Lab Results  Component Value Date   INSULIN 7.2 09/10/2019   CBC    Component Value Date/Time   WBC 7.5 09/10/2019 1040   WBC 13.7 (H) 04/09/2009 0522   RBC 4.40 09/10/2019 1040   RBC 3.21 (L) 04/09/2009 0522   HGB 12.1 09/10/2019 1040   HCT 37.2 09/10/2019 1040   PLT 283 09/10/2019 1040   MCV 85 09/10/2019 1040   MCH 27.5 09/10/2019 1040   MCHC 32.5 09/10/2019 1040   MCHC 33.6 04/09/2009 0522   RDW 13.3 09/10/2019 1040   LYMPHSABS 3.0 09/10/2019 1040   EOSABS 0.1 09/10/2019 1040   BASOSABS 0.0 09/10/2019 1040   Iron/TIBC/Ferritin/ %Sat No results found for: IRON, TIBC, FERRITIN, IRONPCTSAT Lipid Panel     Component Value Date/Time   CHOL 184 09/10/2019 1040   TRIG 88 09/10/2019 1040   HDL 47 09/10/2019 1040   LDLCALC 121 (H) 09/10/2019 1040   Hepatic Function Panel     Component Value Date/Time   PROT 6.9 09/10/2019 1040   ALBUMIN 4.3 09/10/2019 1040   AST 18 09/10/2019 1040   ALT 13 09/10/2019 1040   ALKPHOS 101 09/10/2019 1040   BILITOT 0.2 09/10/2019 1040      Component Value Date/Time   TSH 1.430 09/10/2019 1040   Vitamin D No recent labs  ECG  shows NSR with a rate of 62 BPM INDIRECT CALORIMETER done today shows a VO2 of 260 and a REE of 1810. Her calculated basal metabolic rate is 99991111 thus her basal metabolic rate is better than expected.       OBESITY BEHAVIORAL INTERVENTION VISIT  Today's visit was # 1   Starting weight: 184 lbs Starting date: 09/10/2019 Today's weight : 184 lbs  Today's date: 09/10/2019 Total lbs lost to date: 0    ASK: We discussed the diagnosis of obesity with Marius Ditch today and Alvis Lemmings  agreed to give [place] permission to discuss obesity behavioral modification therapy today.  ASSESS: Tamaira has the diagnosis of obesity and her BMI today is 33.65 Aurelie is in the action stage of change   ADVISE: Raschelle was educated on the multiple health risks of obesity as well as the benefit of  weight loss to improve her health. She was advised of the need for long term treatment and the importance of lifestyle modifications to improve her current health and to decrease her risk of future health problems.  AGREE: Multiple dietary modification options and treatment options were discussed and  Mikiala agreed to follow the recommendations documented in the above note.  ARRANGE: Ramsi was educated on the importance of frequent visits to treat obesity as outlined per CMS and USPSTF guidelines and agreed to schedule her next follow up appointment today.   Wilhemena Durie, am acting as transcriptionist for Briscoe Deutscher, DO  I have reviewed the above documentation for accuracy and completeness, and I agree with the above. Briscoe Deutscher, DO

## 2019-09-16 ENCOUNTER — Encounter (INDEPENDENT_AMBULATORY_CARE_PROVIDER_SITE_OTHER): Payer: Self-pay | Admitting: Family Medicine

## 2019-09-24 ENCOUNTER — Other Ambulatory Visit: Payer: Self-pay

## 2019-09-24 ENCOUNTER — Ambulatory Visit (INDEPENDENT_AMBULATORY_CARE_PROVIDER_SITE_OTHER): Payer: BC Managed Care – PPO | Admitting: Family Medicine

## 2019-09-24 ENCOUNTER — Encounter (INDEPENDENT_AMBULATORY_CARE_PROVIDER_SITE_OTHER): Payer: Self-pay | Admitting: Family Medicine

## 2019-09-24 VITALS — BP 133/74 | HR 64 | Temp 98.3°F | Ht 62.0 in | Wt 181.0 lb

## 2019-09-24 DIAGNOSIS — E7849 Other hyperlipidemia: Secondary | ICD-10-CM

## 2019-09-24 DIAGNOSIS — E559 Vitamin D deficiency, unspecified: Secondary | ICD-10-CM | POA: Diagnosis not present

## 2019-09-24 DIAGNOSIS — Z9189 Other specified personal risk factors, not elsewhere classified: Secondary | ICD-10-CM

## 2019-09-24 DIAGNOSIS — R7303 Prediabetes: Secondary | ICD-10-CM

## 2019-09-24 DIAGNOSIS — I10 Essential (primary) hypertension: Secondary | ICD-10-CM

## 2019-09-24 DIAGNOSIS — E669 Obesity, unspecified: Secondary | ICD-10-CM

## 2019-09-24 DIAGNOSIS — Z6833 Body mass index (BMI) 33.0-33.9, adult: Secondary | ICD-10-CM

## 2019-09-25 NOTE — Progress Notes (Signed)
Office: [phone]  /  Fax: [phone]   HPI:   Chief Complaint: OBESITY [name] is here to discuss her progress with her obesity treatment plan. She is on the Category 2 plan and is following her eating plan approximately 60 % of the time. She states she is walking 1 mile 1 time per week.  [name] is doing well learning the diet. Her sons think the meals are too bland.   Her weight is 181 lb (82.1 kg) today and has had a weight loss of 3 pounds over a period of 2 weeks since her last visit. She has lost 3 lbs since starting treatment with [place].  Pre-Diabetes [name] has a diagnosis of pre-diabetes based on her elevated Hgb A1c and was informed this puts her at greater risk of developing diabetes. Last A1c was 6.0. She is not taking metformin currently and continues to work on diet and exercise to decrease risk of diabetes.   At risk for diabetes [name] is at higher than average risk for developing diabetes due to her obesity and pre-diabetes.   Hyperlipidemia [name] has a diagnosis of hyperlipidemia and has been trying to improve her cholesterol levels with intensive lifestyle modification including a low saturated fat diet, exercise and weight loss. Last LDL was 121. She denies any chest pain, claudication or myalgias.  Vitamin D Deficiency [name] has a diagnosis of vitamin D deficiency. She is not currently taking Vit D and denies nausea, vomiting or muscle weakness.  Hypertension [name] is a 58 y.o. female with hypertension. Arwa's blood pressure is 133/74 today. She is taking spironolactone and denies chest pain. She is working on weight loss to help control her blood pressure with the goal of decreasing her risk of heart attack and stroke.   ASSESSMENT AND PLAN:  Prediabetes  Other hyperlipidemia  Vitamin D deficiency  Essential hypertension  At risk for diabetes mellitus  Class 1 obesity with serious comorbidity and body mass index (BMI) of 33.0 to 33.9 in adult,  unspecified obesity type  PLAN:  Pre-Diabetes [name] will continue to work on weight loss, exercise, and decreasing simple carbohydrates to help decrease the risk of diabetes. We will continue to monitor.  Diabetes risk counseling (~15 min) [name] is a 58 y.o. female and has risk factors for diabetes including obesity and pre-diabetes. We discussed intensive lifestyle modifications today with an emphasis on weight loss as well as increasing exercise and decreasing simple carbohydrates in her diet.  Hyperlipidemia Intensive lifestyle modifications as the first line treatment for hyperlipidemia. We discussed many lifestyle modifications today and [name] will continue to work on diet, exercise and weight loss efforts. We will continue to monitor.  Vitamin D Deficiency Low vitamin D level contributes to fatigue and are associated with obesity, breast, and colon cancer. [name] agrees to start prescription Vit D 50,000 IU every week #4 with no refills. She will follow up for routine testing of vitamin D, at least 2-3 times per year to avoid over-replacement. [name] agrees to follow up with our clinic in 2 to 4 weeks.  Hypertension [name] is working on healthy weight loss and exercise to improve blood pressure control. We will watch for signs of hypotension as she continues her lifestyle modifications.  Obesity [name] is currently in the action stage of change. As such, her goal is to continue with weight loss efforts. She has agreed to follow the Category 2 plan.  [name] has been instructed to work up to a goal of 150  minutes of combined cardio and strengthening exercise per week or 75 minute HIIT per week for weight loss and overall health benefits.  We discussed the following Behavioral Modification Strategies today: increasing lean protein intake, decreasing simple carbohydrates, increasing vegetables, increase H20 intake, and work on meal planning and easy cooking plans  [name] has agreed to follow  up with our clinic in 2 to 4 weeks. She was informed of the importance of frequent follow up visits to maximize her success with intensive lifestyle modifications for her multiple health conditions.  ALLERGIES: No Known Allergies  MEDICATIONS: Current Outpatient Medications on File Prior to Visit  Medication Sig Dispense Refill  . Biotin w/ Vitamins C & E (HAIR/SKIN/NAILS PO) Take by mouth.    . Multiple Vitamins-Minerals (MULTIVITAMIN PO) Take by mouth.    . spironolactone (ALDACTONE) 50 MG tablet Take 50 mg by mouth daily.    Marland Kitchen terbinafine (LAMISIL) 250 MG tablet Take 250 mg by mouth daily.     No current facility-administered medications on file prior to visit.     PAST MEDICAL HISTORY: Past Medical History:  Diagnosis Date  . Abscess of skin 2007  . Acne   . Anemia   . Bilateral swelling of feet and ankles   . Breast abscess 2007  . Breast mass   . Cellulitis 2003  . Constipation   . Fibroid   . Fibroids, submucosal 2008  . H/O: menorrhagia 2008  . Heart murmur   . Hx: UTI (urinary tract infection)   . Joint pain   . Obesity 2010    PAST SURGICAL HISTORY: Past Surgical History:  Procedure Laterality Date  . ABDOMINAL HYSTERECTOMY    . TUBAL LIGATION      SOCIAL HISTORY: Social History   Tobacco Use  . Smoking status: Never Smoker  . Smokeless tobacco: Never Used  Substance Use Topics  . Alcohol use: No  . Drug use: No    FAMILY HISTORY: Family History  Problem Relation Age of Onset  . Hypertension Mother   . Diabetes Mother   . Diabetes Maternal Grandmother   . Hypertension Maternal Grandmother   . Diabetes Maternal Grandfather   . Hypertension Maternal Grandfather   . Cancer Father   . Depression Father     ROS: Review of Systems  Constitutional: Positive for weight loss.  Cardiovascular: Negative for chest pain and claudication.  Gastrointestinal: Negative for nausea and vomiting.  Genitourinary: Negative for frequency.  Musculoskeletal:  Negative for myalgias.       Negative muscle weakness  Endo/Heme/Allergies: Negative for polydipsia.    PHYSICAL EXAM: Blood pressure 133/74, pulse 64, temperature 98.3 F (36.8 C), temperature source Oral, height 5\' 2"  (1.575 m), weight 181 lb (82.1 kg), SpO2 99 %. Body mass index is 33.11 kg/m. Physical Exam Vitals signs reviewed.  Constitutional:      Appearance: Normal appearance. She is obese.  Cardiovascular:     Rate and Rhythm: Normal rate.     Pulses: Normal pulses.  Pulmonary:     Effort: Pulmonary effort is normal.     Breath sounds: Normal breath sounds.  Musculoskeletal: Normal range of motion.  Skin:    General: Skin is warm and dry.  Neurological:     Mental Status: She is alert and oriented to person, place, and time.  Psychiatric:        Mood and Affect: Mood normal.        Behavior: Behavior normal.     RECENT LABS AND  TESTS: BMET    Component Value Date/Time   NA 141 09/10/2019 1040   K 4.2 09/10/2019 1040   CL 104 09/10/2019 1040   CO2 25 09/10/2019 1040   GLUCOSE 85 09/10/2019 1040   BUN 18 09/10/2019 1040   CREATININE 0.91 09/10/2019 1040   CALCIUM 9.1 09/10/2019 1040   GFRNONAA 70 09/10/2019 1040   GFRAA 81 09/10/2019 1040   Lab Results  Component Value Date   HGBA1C 6.0 (H) 09/10/2019   Lab Results  Component Value Date   INSULIN 7.2 09/10/2019   CBC    Component Value Date/Time   WBC 7.5 09/10/2019 1040   WBC 13.7 (H) 04/09/2009 0522   RBC 4.40 09/10/2019 1040   RBC 3.21 (L) 04/09/2009 0522   HGB 12.1 09/10/2019 1040   HCT 37.2 09/10/2019 1040   PLT 283 09/10/2019 1040   MCV 85 09/10/2019 1040   MCH 27.5 09/10/2019 1040   MCHC 32.5 09/10/2019 1040   MCHC 33.6 04/09/2009 0522   RDW 13.3 09/10/2019 1040   LYMPHSABS 3.0 09/10/2019 1040   EOSABS 0.1 09/10/2019 1040   BASOSABS 0.0 09/10/2019 1040   Iron/TIBC/Ferritin/ %Sat No results found for: IRON, TIBC, FERRITIN, IRONPCTSAT Lipid Panel     Component Value Date/Time    CHOL 184 09/10/2019 1040   TRIG 88 09/10/2019 1040   HDL 47 09/10/2019 1040   LDLCALC 121 (H) 09/10/2019 1040   Hepatic Function Panel     Component Value Date/Time   PROT 6.9 09/10/2019 1040   ALBUMIN 4.3 09/10/2019 1040   AST 18 09/10/2019 1040   ALT 13 09/10/2019 1040   ALKPHOS 101 09/10/2019 1040   BILITOT 0.2 09/10/2019 1040      Component Value Date/Time   TSH 1.430 09/10/2019 1040      OBESITY BEHAVIORAL INTERVENTION VISIT  Today's visit was # 2   Starting weight: 184 lbs Starting date: 09/10/2019 Today's weight : 181 lbs  Today's date: 09/24/2019 Total lbs lost to date: 3    ASK: We discussed the diagnosis of obesity with [name] today and [name] agreed to give [place] permission to discuss obesity behavioral modification therapy today.  ASSESS: [name] has the diagnosis of obesity and her BMI today is 33.1 [name] is in the action stage of change   ADVISE: [name] was educated on the multiple health risks of obesity as well as the benefit of weight loss to improve her health. She was advised of the need for long term treatment and the importance of lifestyle modifications to improve her current health and to decrease her risk of future health problems.  AGREE: Multiple dietary modification options and treatment options were discussed and  [name] agreed to follow the recommendations documented in the above note.  ARRANGE: [name] was educated on the importance of frequent visits to treat obesity as outlined per CMS and USPSTF guidelines and agreed to schedule her next follow up appointment today.  [name], am acting as transcriptionist for Briscoe Deutscher, DO  I have reviewed the above documentation for accuracy and completeness, and I agree with the above. Briscoe Deutscher, DO

## 2019-09-26 ENCOUNTER — Encounter (INDEPENDENT_AMBULATORY_CARE_PROVIDER_SITE_OTHER): Payer: Self-pay | Admitting: Family Medicine

## 2019-09-26 MED ORDER — VITAMIN D (ERGOCALCIFEROL) 1.25 MG (50000 UNIT) PO CAPS: 50000.0000 [IU] | ORAL_CAPSULE | ORAL | 0 refills | Status: DC

## 2019-10-18 HISTORY — PX: BREAST CYST ASPIRATION: SHX578

## 2019-10-21 ENCOUNTER — Encounter (INDEPENDENT_AMBULATORY_CARE_PROVIDER_SITE_OTHER): Payer: Self-pay | Admitting: Family Medicine

## 2019-10-21 ENCOUNTER — Ambulatory Visit (INDEPENDENT_AMBULATORY_CARE_PROVIDER_SITE_OTHER): Payer: BC Managed Care – PPO | Admitting: Family Medicine

## 2019-10-21 ENCOUNTER — Other Ambulatory Visit: Payer: Self-pay

## 2019-10-21 VITALS — BP 137/77 | HR 65 | Temp 98.2°F | Ht 62.0 in | Wt 182.0 lb

## 2019-10-21 DIAGNOSIS — I10 Essential (primary) hypertension: Secondary | ICD-10-CM

## 2019-10-21 DIAGNOSIS — E559 Vitamin D deficiency, unspecified: Secondary | ICD-10-CM | POA: Diagnosis not present

## 2019-10-21 DIAGNOSIS — Z9189 Other specified personal risk factors, not elsewhere classified: Secondary | ICD-10-CM

## 2019-10-21 DIAGNOSIS — Z6833 Body mass index (BMI) 33.0-33.9, adult: Secondary | ICD-10-CM

## 2019-10-21 DIAGNOSIS — R7303 Prediabetes: Secondary | ICD-10-CM | POA: Diagnosis not present

## 2019-10-21 DIAGNOSIS — E669 Obesity, unspecified: Secondary | ICD-10-CM

## 2019-10-21 DIAGNOSIS — E7849 Other hyperlipidemia: Secondary | ICD-10-CM

## 2019-10-21 MED ORDER — VITAMIN D (ERGOCALCIFEROL) 1.25 MG (50000 UNIT) PO CAPS: 50000.0000 [IU] | ORAL_CAPSULE | ORAL | 0 refills | Status: DC

## 2019-10-22 NOTE — Progress Notes (Signed)
Chief Complaint: OBESITY [name] is here to discuss her progress with her obesity treatment plan. [name] is on the Category 2 Plan and fasting for church and states she is following her eating plan approximately 70% of the time. [name] states she is doing Body Pump for 45 minutes 2 times per week and cardio dance for 60 minutes 2 times per week.  Today's visit was #: 3 Starting weight: 184 lbs Starting date: 09/10/2019 Today's weight: 182 lbs Today's date: 10/22/2019 Total lbs lost to date: 2 lbs Total lbs lost since last in-office visit: 0  Subjective:   Interim History: [name] says she has been able to go back to the [facility], and she is enjoying it.  Assessment/Plan:   1. Prediabetes Her last A1c on 09/10/2019 was 6.0. She has no polyuria or polydipsia. Spiritual will continue to work on weight loss, exercise, and decreasing simple carbohydrates to help decrease the risk of diabetes.   2. Other hyperlipidemia [name] had an LDL of 121 on 09/10/2019. Cardiovascular risk and specific lipid/LDL goals reviewed. We discussed several lifestyle modifications today and [name] will continue to work on diet, exercise and weight loss efforts. Orders and follow up as documented in patient record.   Counseling Intensive lifestyle modifications are the first line treatment for this issue. . Dietary changes: Increase soluble fiber. Decrease simple carbohydrates. . Exercise changes: Moderate to vigorous-intensity aerobic activity 150 minutes per week if tolerated. . Lipid-lowering medications: see documented in medical record.  3. Vitamin D deficiency Her last vitamin D level on 09/10/2019 was 38.5.  I will continue her on high-dose vitamin D.  She has no nausea, vomiting, or muscle weakness.  - Vitamin D, Ergocalciferol, (DRISDOL) 1.25 MG (50000 UT) CAPS capsule; Take 1 capsule (50,000 Units total) by mouth every 7 (seven) days.  Dispense: 4 capsule; Refill: 0.  She agrees to follow-up with our  clinic in 2 weeks.  4. Essential hypertension [name] is taking spironolactone. Cardiovascular ROS: no chest pain or dyspnea on exertion. [name] is working on healthy weight loss and exercise to improve blood pressure control. We will watch for signs of hypotension as she continues her lifestyle modifications.  BP Readings from Last 3 Encounters:  10/21/19 137/77  09/24/19 133/74  09/10/19 (!) 148/76   5. At risk for diabetes mellitus [name] is a 59 y.o. female and has risk factors for diabetes including obesity. We discussed intensive lifestyle modifications today with an emphasis on weight loss as well as increasing exercise and decreasing simple carbohydrates in her diet. (15 minutes)  6. Class 1 obesity with serious comorbidity and body mass index (BMI) of 33.0 to 33.9 in adult, unspecified obesity type [name] is currently in the action stage of change. As such, her goal is to continue with weight loss efforts. She has agreed to Category 2 Plan.   We discussed the following exercise goals today: For substantial health benefits, adults should do at least 150 minutes (2 hours and 30 minutes) a week of moderate-intensity, or 75 minutes (1 hour and 15 minutes) a week of vigorous-intensity aerobic physical activity, or an equivalent combination of moderate- and vigorous-intensity aerobic activity. Aerobic activity should be performed in episodes of at least 10 minutes, and preferably, it should be spread throughout the week. Adults should also include muscle-strengthening activities that involve all major muscle groups on 2 or more days a week.  We discussed the following behavioral modification strategies today: increasing lean protein intake, decreasing simple carbohydrates,  increasing vegetables and increasing water intake.  [name] has agreed to follow-up with our clinic in 2 weeks. She was informed of the importance of frequent follow-up visits to maximize her success with intensive  lifestyle modifications for her multiple health conditions.  Objective:   Blood pressure 137/77, pulse 65, temperature 98.2 F (36.8 C), temperature source Oral, height 5\' 2"  (1.575 m), weight 182 lb (82.6 kg), SpO2 100 %. Body mass index is 33.29 kg/m.  General: Cooperative, alert, well developed, in no acute distress. HEENT: Conjunctivae and lids unremarkable. Neck: No thyromegaly.  Cardiovascular: Regular rhythm.  Lungs: Normal work of breathing. Extremities: No edema.  Neurologic: No focal deficits.   Lab Results  Component Value Date   CREATININE 0.91 09/10/2019   BUN 18 09/10/2019   NA 141 09/10/2019   K 4.2 09/10/2019   CL 104 09/10/2019   CO2 25 09/10/2019   Lab Results  Component Value Date   ALT 13 09/10/2019   AST 18 09/10/2019   ALKPHOS 101 09/10/2019   BILITOT 0.2 09/10/2019   Lab Results  Component Value Date   HGBA1C 6.0 (H) 09/10/2019   Lab Results  Component Value Date   INSULIN 7.2 09/10/2019   Lab Results  Component Value Date   TSH 1.430 09/10/2019   Lab Results  Component Value Date   CHOL 184 09/10/2019   HDL 47 09/10/2019   LDLCALC 121 (H) 09/10/2019   TRIG 88 09/10/2019   Lab Results  Component Value Date   WBC 7.5 09/10/2019   HGB 12.1 09/10/2019   HCT 37.2 09/10/2019   MCV 85 09/10/2019   PLT 283 09/10/2019   Attestation Statements:   Reviewed by clinician on day of visit: allergies, medications, problem list, medical history, surgical history, family history, social history and previous encounter notes.  This visit occurred during the SARS-CoV-2 public health emergency. Safety protocols were in place, including screening questions prior to the visit, additional usage of staff PPE, and extensive cleaning of exam room while observing appropriate contact time as indicated for disinfecting solutions. (CPT [id number])  I, Water quality scientist, am acting as Location manager for [employer], DO.  I have reviewed the above documentation for  accuracy and completeness, and I agree with the above. Briscoe Deutscher, DO

## 2019-10-23 ENCOUNTER — Other Ambulatory Visit: Payer: Self-pay | Admitting: Obstetrics and Gynecology

## 2019-10-23 DIAGNOSIS — N6320 Unspecified lump in the left breast, unspecified quadrant: Secondary | ICD-10-CM

## 2019-10-25 ENCOUNTER — Encounter (INDEPENDENT_AMBULATORY_CARE_PROVIDER_SITE_OTHER): Payer: Self-pay | Admitting: Family Medicine

## 2019-11-01 ENCOUNTER — Ambulatory Visit
Admission: RE | Admit: 2019-11-01 | Discharge: 2019-11-01 | Disposition: A | Discharge status: Home / Self Care | Payer: BC Managed Care – PPO | Source: Ambulatory Visit | Attending: Obstetrics and Gynecology | Admitting: Obstetrics and Gynecology

## 2019-11-01 ENCOUNTER — Other Ambulatory Visit: Payer: Self-pay

## 2019-11-01 ENCOUNTER — Other Ambulatory Visit: Payer: Self-pay | Admitting: Obstetrics and Gynecology

## 2019-11-01 DIAGNOSIS — N6320 Unspecified lump in the left breast, unspecified quadrant: Secondary | ICD-10-CM

## 2019-11-04 ENCOUNTER — Ambulatory Visit (INDEPENDENT_AMBULATORY_CARE_PROVIDER_SITE_OTHER): Payer: BC Managed Care – PPO | Admitting: Family Medicine

## 2019-11-11 ENCOUNTER — Encounter (INDEPENDENT_AMBULATORY_CARE_PROVIDER_SITE_OTHER): Payer: Self-pay | Admitting: Family Medicine

## 2019-11-11 ENCOUNTER — Ambulatory Visit (INDEPENDENT_AMBULATORY_CARE_PROVIDER_SITE_OTHER): Payer: BC Managed Care – PPO | Admitting: Family Medicine

## 2019-11-11 ENCOUNTER — Other Ambulatory Visit: Payer: Self-pay

## 2019-11-11 VITALS — BP 127/76 | HR 68 | Temp 98.4°F | Ht 62.0 in | Wt 182.0 lb

## 2019-11-11 DIAGNOSIS — E669 Obesity, unspecified: Secondary | ICD-10-CM

## 2019-11-11 DIAGNOSIS — Z6833 Body mass index (BMI) 33.0-33.9, adult: Secondary | ICD-10-CM

## 2019-11-11 DIAGNOSIS — I10 Essential (primary) hypertension: Secondary | ICD-10-CM

## 2019-11-11 DIAGNOSIS — E7849 Other hyperlipidemia: Secondary | ICD-10-CM

## 2019-11-11 DIAGNOSIS — E559 Vitamin D deficiency, unspecified: Secondary | ICD-10-CM | POA: Diagnosis not present

## 2019-11-11 DIAGNOSIS — Z9189 Other specified personal risk factors, not elsewhere classified: Secondary | ICD-10-CM

## 2019-11-11 DIAGNOSIS — R7303 Prediabetes: Secondary | ICD-10-CM | POA: Diagnosis not present

## 2019-11-12 NOTE — Progress Notes (Signed)
Chief Complaint:   OBESITY [name] is here to discuss her progress with her obesity treatment plan along with follow-up of her obesity related diagnoses. [name] is on the Category 2 Plan and states she is following her eating plan approximately 70% of the time. [name] states she is doing group exercise for 45 minutes 2 times per week.  Today's visit was #: 4 Starting weight: 184 lbs Starting date: 09/10/2019 Today's weight: 182 lbs Today's date: 11/11/2019 Total lbs lost to date: 2 lbs Total lbs lost since last in-office visit: 0  Interim History: [name] reports that she eats a good breakfast and then eats a sandwich at dinner because she does not eat until 4 pm.  She says the [employer] is full, so she has not been able to exercise as much.  Subjective:   1. Other hyperlipidemia [name] has hyperlipidemia and has been trying to improve her cholesterol levels with intensive lifestyle modification including a low saturated fat diet, exercise and weight loss. She denies any chest pain, claudication or myalgias.  Lab Results  Component Value Date   ALT 13 09/10/2019   AST 18 09/10/2019   ALKPHOS 101 09/10/2019   BILITOT 0.2 09/10/2019   Lab Results  Component Value Date   CHOL 184 09/10/2019   HDL 47 09/10/2019   LDLCALC 121 (H) 09/10/2019   TRIG 88 09/10/2019   2. Vitamin D deficiency [name]'s Vitamin D level was 38.5 on 09/10/2019. She is currently taking vit D. She denies nausea, vomiting or muscle weakness.  3. Essential hypertension Review: taking medications as instructed, no medication side effects noted, no chest pain on exertion, no dyspnea on exertion, no swelling of ankles.   BP Readings from Last 3 Encounters:  11/11/19 127/76  10/21/19 137/77  09/24/19 133/74   4. Prediabetes [name] has a diagnosis of prediabetes based on her elevated HgA1c and was informed this puts her at greater risk of developing diabetes. She continues to work on diet and exercise to decrease her  risk of diabetes. She denies nausea or hypoglycemia.  Lab Results  Component Value Date   HGBA1C 6.0 (H) 09/10/2019   Lab Results  Component Value Date   INSULIN 7.2 09/10/2019   5. At risk for diabetes mellitus [name] is at higher than average risk for developing diabetes due to her obesity.   Assessment/Plan:   1. Other hyperlipidemia Cardiovascular risk and specific lipid/LDL goals reviewed.  We discussed several lifestyle modifications today and [name] will continue to work on diet, exercise and weight loss efforts. Orders and follow up as documented in patient record.   Counseling Intensive lifestyle modifications are the first line treatment for this issue. . Dietary changes: Increase soluble fiber. Decrease simple carbohydrates. . Exercise changes: Moderate to vigorous-intensity aerobic activity 150 minutes per week if tolerated. . Lipid-lowering medications: see documented in medical record.  2. Vitamin D deficiency Low Vitamin D level contributes to fatigue and are associated with obesity, breast, and colon cancer. She agrees to continue to take prescription Vitamin D @50 ,000 IU every week and will follow-up for routine testing of Vitamin D, at least 2-3 times per year to avoid over-replacement.  Orders - Vitamin D, Ergocalciferol, (DRISDOL) 1.25 MG (50000 UNIT) CAPS capsule; Take 1 capsule (50,000 Units total) by mouth every 7 (seven) days.  Dispense: 4 capsule; Refill: 0  3. Essential hypertension [name] is working on healthy weight loss and exercise to improve blood pressure control. We will watch for signs of hypotension  as she continues her lifestyle modifications.  4. Prediabetes [name] will continue to work on weight loss, exercise, and decreasing simple carbohydrates to help decrease the risk of diabetes.  Will consider metformin if still stalled at next visit.  5. At risk for diabetes mellitus [name] was given approximately 15 minutes of diabetes education and  counseling today. We discussed intensive lifestyle modifications today with an emphasis on weight loss as well as increasing exercise and decreasing simple carbohydrates in her diet. We also reviewed medication options with an emphasis on risk versus benefit of those discussed.   Repetitive spaced learning was employed today to elicit superior memory formation and behavioral change.  6. Class 1 obesity with serious comorbidity and body mass index (BMI) of 33.0 to 33.9 in adult, unspecified obesity type [name] is currently in the action stage of change. As such, her goal is to continue with weight loss efforts. She has agreed to the Category 2 Plan.   Exercise goals: All adults should avoid inactivity. Some physical activity is better than none, and adults who participate in any amount of physical activity gain some health benefits. For additional and more extensive health benefits, adults should increase their aerobic physical activity to 300 minutes (5 hours) a week of moderate-intensity, or 150 minutes a week of vigorous-intensity aerobic physical activity, or an equivalent combination of moderate- and vigorous-intensity activity. Additional health benefits are gained by engaging in physical activity beyond this amount.   Behavioral modification strategies: increasing lean protein intake and increasing water intake.  [name] will set an alarm and work on stopping for lunch/self care.  [name] has agreed to follow-up with our clinic in 2 weeks. She was informed of the importance of frequent follow-up visits to maximize her success with intensive lifestyle modifications for her multiple health conditions.   Objective:   Blood pressure 127/76, pulse 68, temperature 98.4 F (36.9 C), temperature source Oral, height 5\' 2"  (1.575 m), weight 182 lb (82.6 kg), SpO2 99 %. Body mass index is 33.29 kg/m.  General: Cooperative, alert, well developed, in no acute distress. HEENT: Conjunctivae and lids  unremarkable. Cardiovascular: Regular rhythm.  Lungs: Normal work of breathing. Neurologic: No focal deficits.   Lab Results  Component Value Date   CREATININE 0.91 09/10/2019   BUN 18 09/10/2019   NA 141 09/10/2019   K 4.2 09/10/2019   CL 104 09/10/2019   CO2 25 09/10/2019   Lab Results  Component Value Date   ALT 13 09/10/2019   AST 18 09/10/2019   ALKPHOS 101 09/10/2019   BILITOT 0.2 09/10/2019   Lab Results  Component Value Date   HGBA1C 6.0 (H) 09/10/2019   Lab Results  Component Value Date   INSULIN 7.2 09/10/2019   Lab Results  Component Value Date   TSH 1.430 09/10/2019   Lab Results  Component Value Date   CHOL 184 09/10/2019   HDL 47 09/10/2019   LDLCALC 121 (H) 09/10/2019   TRIG 88 09/10/2019   Lab Results  Component Value Date   WBC 7.5 09/10/2019   HGB 12.1 09/10/2019   HCT 37.2 09/10/2019   MCV 85 09/10/2019   PLT 283 09/10/2019   Attestation Statements:   Reviewed by clinician on day of visit: allergies, medications, problem list, medical history, surgical history, family history, social history, and previous encounter notes.  I, Water quality scientist, CMA, am acting as Location manager for [employer], DO.  I have reviewed the above documentation for accuracy and completeness, and I agree  with the above. Briscoe Deutscher, DO

## 2019-11-13 ENCOUNTER — Encounter (INDEPENDENT_AMBULATORY_CARE_PROVIDER_SITE_OTHER): Payer: Self-pay | Admitting: Family Medicine

## 2019-11-13 MED ORDER — VITAMIN D (ERGOCALCIFEROL) 1.25 MG (50000 UNIT) PO CAPS: 50000.0000 [IU] | ORAL_CAPSULE | ORAL | 0 refills | Status: DC

## 2019-11-27 ENCOUNTER — Encounter (INDEPENDENT_AMBULATORY_CARE_PROVIDER_SITE_OTHER): Payer: Self-pay | Admitting: Family Medicine

## 2019-11-27 ENCOUNTER — Ambulatory Visit (INDEPENDENT_AMBULATORY_CARE_PROVIDER_SITE_OTHER): Payer: BC Managed Care – PPO | Admitting: Family Medicine

## 2019-11-27 ENCOUNTER — Other Ambulatory Visit: Payer: Self-pay

## 2019-11-27 VITALS — BP 118/73 | HR 66 | Temp 98.0°F | Ht 62.0 in | Wt 179.0 lb

## 2019-11-27 DIAGNOSIS — R7303 Prediabetes: Secondary | ICD-10-CM

## 2019-11-27 DIAGNOSIS — I10 Essential (primary) hypertension: Secondary | ICD-10-CM | POA: Diagnosis not present

## 2019-11-27 DIAGNOSIS — E7849 Other hyperlipidemia: Secondary | ICD-10-CM

## 2019-11-27 DIAGNOSIS — Z6832 Body mass index (BMI) 32.0-32.9, adult: Secondary | ICD-10-CM

## 2019-11-27 DIAGNOSIS — E669 Obesity, unspecified: Secondary | ICD-10-CM

## 2019-11-27 DIAGNOSIS — E559 Vitamin D deficiency, unspecified: Secondary | ICD-10-CM

## 2019-11-27 NOTE — Progress Notes (Addendum)
Chief Complaint:   OBESITY Shiona is here to discuss her progress with her obesity treatment plan along with follow-up of her obesity related diagnoses. Isatu is on the Category 2 Plan and states she is following her eating plan approximately 80% of the time. Isabelita states she is walking for 60 minutes 2 times a week and going to the gym for 45 minutes 2 times per week.  Today's visit was #: 5 Starting weight: 184 lbs Starting date: 09/10/2019 Today's weight: 179 lbs Today's date: 11/27/2019 Total lbs lost to date: 5 lbs Total lbs lost since last in-office visit: 3 lbs  Interim History: Clydia has been having a hard time getting into exercise classes at the [facility].  She says she did not eat all day yesterday because she went to [place] to take her mother to the neurologist.  Subjective:   1. Vitamin D deficiency Caylyn's Vitamin D level was 38.5 on 09/10/2019. She is currently taking vit D. She denies nausea, vomiting or muscle weakness.  2. Prediabetes Lomie has a diagnosis of prediabetes based on her elevated HgA1c and was informed this puts her at greater risk of developing diabetes. She continues to work on diet and exercise to decrease her risk of diabetes. She denies nausea or hypoglycemia.  Lab Results  Component Value Date   HGBA1C 6.0 (H) 09/10/2019   Lab Results  Component Value Date   INSULIN 7.2 09/10/2019   3. Essential hypertension Review: taking medications as instructed, no medication side effects noted, no chest pain on exertion, no dyspnea on exertion, no swelling of ankles.   BP Readings from Last 3 Encounters:  11/27/19 118/73  11/11/19 127/76  10/21/19 137/77   4. Other hyperlipidemia Ervin has hyperlipidemia and has been trying to improve her cholesterol levels with intensive lifestyle modification including a low saturated fat diet, exercise and weight loss. She denies any chest pain, claudication or myalgias.  Lab Results  Component Value Date   ALT 13 09/10/2019   AST 18 09/10/2019   ALKPHOS 101 09/10/2019   BILITOT 0.2 09/10/2019   Lab Results  Component Value Date   CHOL 184 09/10/2019   HDL 47 09/10/2019   LDLCALC 121 (H) 09/10/2019   TRIG 88 09/10/2019   Assessment/Plan:   1. Vitamin D deficiency Low Vitamin D level contributes to fatigue and are associated with obesity, breast, and colon cancer. She agrees to continue to take prescription Vitamin D @50 ,000 IU every week and will follow-up for routine testing of Vitamin D, at least 2-3 times per year to avoid over-replacement.  Orders - Vitamin D, Ergocalciferol, (DRISDOL) 1.25 MG (50000 UNIT) CAPS capsule; Take 1 capsule (50,000 Units total) by mouth every 7 (seven) days.  Dispense: 4 capsule; Refill: 0  2. Prediabetes Carlyn will continue to work on weight loss, exercise, and decreasing simple carbohydrates to help decrease the risk of diabetes.   3. Essential hypertension Khristie is working on healthy weight loss and exercise to improve blood pressure control. We will watch for signs of hypotension as she continues her lifestyle modifications.  4. Other hyperlipidemia Cardiovascular risk and specific lipid/LDL goals reviewed.  We discussed several lifestyle modifications today and Halaya will continue to work on diet, exercise and weight loss efforts. Orders and follow up as documented in patient record.   Counseling Intensive lifestyle modifications are the first line treatment for this issue. . Dietary changes: Increase soluble fiber. Decrease simple carbohydrates. . Exercise changes: Moderate to vigorous-intensity aerobic activity  150 minutes per week if tolerated. . Lipid-lowering medications: see documented in medical record.  5. At risk for diabetes mellitus Tamorah was given approximately 15 minutes of diabetes education and counseling today. We discussed intensive lifestyle modifications today with an emphasis on weight loss as well as increasing exercise and  decreasing simple carbohydrates in her diet. We also reviewed medication options with an emphasis on risk versus benefit of those discussed.   Repetitive spaced learning was employed today to elicit superior memory formation and behavioral change.  6. Class 1 obesity with serious comorbidity and body mass index (BMI) of 32.0 to 32.9 in adult, unspecified obesity type Icess is currently in the action stage of change. As such, her goal is to continue with weight loss efforts. She has agreed to the Category 2 Plan.   Exercise goals: For substantial health benefits, adults should do at least 150 minutes (2 hours and 30 minutes) a week of moderate-intensity, or 75 minutes (1 hour and 15 minutes) a week of vigorous-intensity aerobic physical activity, or an equivalent combination of moderate- and vigorous-intensity aerobic activity. Aerobic activity should be performed in episodes of at least 10 minutes, and preferably, it should be spread throughout the week.  Behavioral modification strategies: increasing lean protein intake and increasing water intake.  Kenia has agreed to follow-up with our clinic in 2 weeks. She was informed of the importance of frequent follow-up visits to maximize her success with intensive lifestyle modifications for her multiple health conditions.   Objective:   Blood pressure 118/73, pulse 66, temperature 98 F (36.7 C), temperature source Oral, height 5\' 2"  (1.575 m), weight 179 lb (81.2 kg), SpO2 99 %. Body mass index is 32.74 kg/m.  General: Cooperative, alert, well developed, in no acute distress. HEENT: Conjunctivae and lids unremarkable. Cardiovascular: Regular rhythm.  Lungs: Normal work of breathing. Neurologic: No focal deficits.   Lab Results  Component Value Date   CREATININE 0.91 09/10/2019   BUN 18 09/10/2019   NA 141 09/10/2019   K 4.2 09/10/2019   CL 104 09/10/2019   CO2 25 09/10/2019   Lab Results  Component Value Date   ALT 13 09/10/2019    AST 18 09/10/2019   ALKPHOS 101 09/10/2019   BILITOT 0.2 09/10/2019   Lab Results  Component Value Date   HGBA1C 6.0 (H) 09/10/2019   Lab Results  Component Value Date   INSULIN 7.2 09/10/2019   Lab Results  Component Value Date   TSH 1.430 09/10/2019   Lab Results  Component Value Date   CHOL 184 09/10/2019   HDL 47 09/10/2019   LDLCALC 121 (H) 09/10/2019   TRIG 88 09/10/2019   Lab Results  Component Value Date   WBC 7.5 09/10/2019   HGB 12.1 09/10/2019   HCT 37.2 09/10/2019   MCV 85 09/10/2019   PLT 283 09/10/2019   Attestation Statements:   Reviewed by clinician on day of visit: allergies, medications, problem list, medical history, surgical history, family history, social history, and previous encounter notes.  I, Water quality scientist, CMA, am acting as Location manager for [employer], DO.  I have reviewed the above documentation for accuracy and completeness, and I agree with the above. Briscoe Deutscher, DO

## 2019-11-28 MED ORDER — VITAMIN D (ERGOCALCIFEROL) 1.25 MG (50000 UNIT) PO CAPS: 50000.0000 [IU] | ORAL_CAPSULE | ORAL | 0 refills | Status: DC

## 2019-12-16 ENCOUNTER — Encounter (INDEPENDENT_AMBULATORY_CARE_PROVIDER_SITE_OTHER): Payer: Self-pay | Admitting: Physician Assistant

## 2019-12-16 ENCOUNTER — Other Ambulatory Visit: Payer: Self-pay

## 2019-12-16 ENCOUNTER — Ambulatory Visit (INDEPENDENT_AMBULATORY_CARE_PROVIDER_SITE_OTHER): Payer: BC Managed Care – PPO | Admitting: Physician Assistant

## 2019-12-16 VITALS — BP 132/79 | HR 67 | Temp 98.0°F | Ht 62.0 in | Wt 184.0 lb

## 2019-12-16 DIAGNOSIS — E559 Vitamin D deficiency, unspecified: Secondary | ICD-10-CM | POA: Diagnosis not present

## 2019-12-16 DIAGNOSIS — E7849 Other hyperlipidemia: Secondary | ICD-10-CM

## 2019-12-16 DIAGNOSIS — Z9189 Other specified personal risk factors, not elsewhere classified: Secondary | ICD-10-CM

## 2019-12-16 DIAGNOSIS — E669 Obesity, unspecified: Secondary | ICD-10-CM

## 2019-12-16 DIAGNOSIS — Z6833 Body mass index (BMI) 33.0-33.9, adult: Secondary | ICD-10-CM

## 2019-12-17 MED ORDER — VITAMIN D (ERGOCALCIFEROL) 1.25 MG (50000 UNIT) PO CAPS: 50000.0000 [IU] | ORAL_CAPSULE | ORAL | 0 refills | Status: DC

## 2019-12-17 NOTE — Progress Notes (Signed)
Chief Complaint:   OBESITY [name] is here to discuss her progress with her obesity treatment plan along with follow-up of her obesity related diagnoses. [name] is on the Category 2 Plan and states she is following her eating plan approximately 70% of the time. [name] states she is exercising 0 minutes 0 times per week.  Today's visit was #: 6 Starting weight: 184 lbs Starting date: 09/10/2019 Today's weight: 184 lbs Today's date: 12/16/2019 Total lbs lost to date: 0 Total lbs lost since last in-office visit: 0  Interim History: [name] states that she is not eating enough protein for breakfast and sometimes at lunch. She wants to start exercising.  Subjective:   Other hyperlipidemia. [name] is on no medications. No chest pain. She wants to start exercising.   Lab Results  Component Value Date   CHOL 184 09/10/2019   HDL 47 09/10/2019   LDLCALC 121 (H) 09/10/2019   TRIG 88 09/10/2019   Lab Results  Component Value Date   ALT 13 09/10/2019   AST 18 09/10/2019   ALKPHOS 101 09/10/2019   BILITOT 0.2 09/10/2019   The 10-year ASCVD risk score Mikey Bussing DC Jr., et al., 2013) is: 6.5%   Values used to calculate the score:     Age: 59 years     Sex: Female     Is Non-Hispanic African American: Yes     Diabetic: No     Tobacco smoker: No     Systolic Blood Pressure: Q000111Q mmHg     Is BP treated: Yes     HDL Cholesterol: 47 mg/dL     Total Cholesterol: 184 mg/dL  Vitamin D deficiency. Last Vitamin D level not at goal - 38.5 on 09/10/2019. [name] is on prescription Vitamin D weekly. No nausea, vomiting, or muscle weakness.  At risk for heart disease. [name] is at a higher than average risk for cardiovascular disease due to obesity. Reviewed: no chest pain on exertion, no dyspnea on exertion, and no swelling of ankles.  Assessment/Plan:   Other hyperlipidemia. Cardiovascular risk and specific lipid/LDL goals reviewed.  We discussed several lifestyle modifications today and [name]  will continue to work on diet, begin exercise, and continue weight loss efforts. Orders and follow up as documented in patient record.   Counseling Intensive lifestyle modifications are the first line treatment for this issue. . Dietary changes: Increase soluble fiber. Decrease simple carbohydrates. . Exercise changes: Moderate to vigorous-intensity aerobic activity 150 minutes per week if tolerated. . Lipid-lowering medications: see documented in medical record.  Vitamin D deficiency. Low Vitamin D level contributes to fatigue and are associated with obesity, breast, and colon cancer. She was given a refill on her Vitamin D, Ergocalciferol, (DRISDOL) 1.25 MG (50000 UNIT) CAPS capsule every week #4 with 0 refills and will follow-up for routine testing of Vitamin D, at least 2-3 times per year to avoid over-replacement.    At risk for heart disease. [name] was given approximately 15 minutes of coronary artery disease prevention counseling today. She is 59 y.o. female and has risk factors for heart disease including obesity. We discussed intensive lifestyle modifications today with an emphasis on specific weight loss instructions and strategies.   Repetitive spaced learning was employed today to elicit superior memory formation and behavioral change.  Class 1 obesity with serious comorbidity and body mass index (BMI) of 33.0 to 33.9 in adult, unspecified obesity type.  [name] is currently in the action stage of change. As such, her  goal is to continue with weight loss efforts. She has agreed to the Category 2 Plan.   Exercise goals: For substantial health benefits, adults should do at least 150 minutes (2 hours and 30 minutes) a week of moderate-intensity, or 75 minutes (1 hour and 15 minutes) a week of vigorous-intensity aerobic physical activity, or an equivalent combination of moderate- and vigorous-intensity aerobic activity. Aerobic activity should be performed in episodes of at least 10 minutes,  and preferably, it should be spread throughout the week.  Behavioral modification strategies: increasing lean protein intake and meal planning and cooking strategies.  [name] has agreed to follow-up with our clinic in 2-3 weeks. She was informed of the importance of frequent follow-up visits to maximize her success with intensive lifestyle modifications for her multiple health conditions.   Objective:   Blood pressure 132/79, pulse 67, temperature 98 F (36.7 C), temperature source Oral, height 5\' 2"  (1.575 m), weight 184 lb (83.5 kg), SpO2 97 %. Body mass index is 33.65 kg/m.  General: Cooperative, alert, well developed, in no acute distress. HEENT: Conjunctivae and lids unremarkable. Cardiovascular: Regular rhythm.  Lungs: Normal work of breathing. Neurologic: No focal deficits.   Lab Results  Component Value Date   CREATININE 0.91 09/10/2019   BUN 18 09/10/2019   NA 141 09/10/2019   K 4.2 09/10/2019   CL 104 09/10/2019   CO2 25 09/10/2019   Lab Results  Component Value Date   ALT 13 09/10/2019   AST 18 09/10/2019   ALKPHOS 101 09/10/2019   BILITOT 0.2 09/10/2019   Lab Results  Component Value Date   HGBA1C 6.0 (H) 09/10/2019   Lab Results  Component Value Date   INSULIN 7.2 09/10/2019   Lab Results  Component Value Date   TSH 1.430 09/10/2019   Lab Results  Component Value Date   CHOL 184 09/10/2019   HDL 47 09/10/2019   LDLCALC 121 (H) 09/10/2019   TRIG 88 09/10/2019   Lab Results  Component Value Date   WBC 7.5 09/10/2019   HGB 12.1 09/10/2019   HCT 37.2 09/10/2019   MCV 85 09/10/2019   PLT 283 09/10/2019   No results found for: IRON, TIBC, FERRITIN  Attestation Statements:   Reviewed by clinician on day of visit: allergies, medications, problem list, medical history, surgical history, family history, social history, and previous encounter notes.  IMichaelene Song, am acting as transcriptionist for Abby Potash, PA-C   I have reviewed the  above documentation for accuracy and completeness, and I agree with the above. Abby Potash, PA-C

## 2020-01-08 ENCOUNTER — Other Ambulatory Visit: Payer: Self-pay

## 2020-01-08 ENCOUNTER — Encounter (INDEPENDENT_AMBULATORY_CARE_PROVIDER_SITE_OTHER): Payer: Self-pay | Admitting: Physician Assistant

## 2020-01-08 ENCOUNTER — Ambulatory Visit (INDEPENDENT_AMBULATORY_CARE_PROVIDER_SITE_OTHER): Payer: BC Managed Care – PPO | Admitting: Physician Assistant

## 2020-01-08 VITALS — BP 150/80 | HR 62 | Temp 98.0°F | Ht 62.0 in | Wt 180.0 lb

## 2020-01-08 DIAGNOSIS — E559 Vitamin D deficiency, unspecified: Secondary | ICD-10-CM

## 2020-01-08 DIAGNOSIS — E7849 Other hyperlipidemia: Secondary | ICD-10-CM

## 2020-01-08 DIAGNOSIS — Z6833 Body mass index (BMI) 33.0-33.9, adult: Secondary | ICD-10-CM

## 2020-01-08 DIAGNOSIS — Z9189 Other specified personal risk factors, not elsewhere classified: Secondary | ICD-10-CM | POA: Diagnosis not present

## 2020-01-08 DIAGNOSIS — R7303 Prediabetes: Secondary | ICD-10-CM | POA: Diagnosis not present

## 2020-01-08 DIAGNOSIS — E669 Obesity, unspecified: Secondary | ICD-10-CM

## 2020-01-08 NOTE — Progress Notes (Signed)
Chief Complaint:   OBESITY [name] is here to discuss her progress with her obesity treatment plan along with follow-up of her obesity related diagnoses. [name] is on the Category 2 Plan and states she is following her eating plan approximately 80-85% of the time. [name] states she is walking 15 minutes 5 times per week.  Today's visit was #: 7 Starting weight: 184 lbs Starting date: 09/10/2019 Today's weight: 180 lbs Today's date: 01/08/2020 Total lbs lost to date: 4 Total lbs lost since last in-office visit: 4  Interim History: [name] states she has done better with increasing her protein with breakfast and lunch. She is looking for options for other forms of protein.  Subjective:   Other hyperlipidemia. Munira is on no medications. No chest pain.   Lab Results  Component Value Date   CHOL 184 09/10/2019   HDL 47 09/10/2019   LDLCALC 121 (H) 09/10/2019   TRIG 88 09/10/2019   Lab Results  Component Value Date   ALT 13 09/10/2019   AST 18 09/10/2019   ALKPHOS 101 09/10/2019   BILITOT 0.2 09/10/2019   The 10-year ASCVD risk score Mikey Bussing DC Jr., et al., 2013) is: 9.8%   Values used to calculate the score:     Age: 59 years     Sex: Female     Is Non-Hispanic African American: Yes     Diabetic: No     Tobacco smoker: No     Systolic Blood Pressure: Q000111Q mmHg     Is BP treated: Yes     HDL Cholesterol: 47 mg/dL     Total Cholesterol: 184 mg/dL     Vitamin D deficiency. [name] is on Vitamin D weekly. Last Vitamin D level was not at goal - 38.5 on 09/10/2019.  Prediabetes. [name] has a diagnosis of prediabetes based on her elevated HgA1c and was informed this puts her at greater risk of developing diabetes. She continues to work on diet and exercise to decrease her risk of diabetes. She denies nausea or hypoglycemia. [name] is on no medications. No polyphagia.  Lab Results  Component Value Date   HGBA1C 6.0 (H) 09/10/2019   Lab Results  Component Value Date   INSULIN 7.2 09/10/2019    At risk for osteoporosis. [name] is at higher risk of osteopenia and osteoporosis due to Vitamin D deficiency.   Assessment/Plan:   Other hyperlipidemia. Cardiovascular risk and specific lipid/LDL goals reviewed.  We discussed several lifestyle modifications today and [name] will continue to work on diet, exercise and weight loss efforts. Orders and follow up as documented in patient record. Lipid Panel With LDL/HDL Ratio labs ordered today.  Counseling Intensive lifestyle modifications are the first line treatment for this issue. . Dietary changes: Increase soluble fiber. Decrease simple carbohydrates. . Exercise changes: Moderate to vigorous-intensity aerobic activity 150 minutes per week if tolerated. . Lipid-lowering medications: see documented in medical record.    Vitamin D deficiency. Low Vitamin D level contributes to fatigue and are associated with obesity, breast, and colon cancer. She agrees to continue to take Vitamin D and VITAMIN D 25 Hydroxy (Vit-D Deficiency, Fractures) level was ordered.  Prediabetes. Tameki will continue to work on weight loss, exercise, and decreasing simple carbohydrates to help decrease the risk of diabetes. Comprehensive metabolic panel, Hemoglobin A1c, Insulin, random labs ordered.  At risk for osteoporosis. Alleta was given approximately 15 minutes of osteoporosis prevention counseling today. [name] is at risk for osteopenia and osteoporosis due to  her Vitamin D deficiency. She was encouraged to take her Vitamin D and follow her higher calcium diet and increase strengthening exercise to help strengthen her bones and decrease her risk of osteopenia and osteoporosis.  Repetitive spaced learning was employed today to elicit superior memory formation and behavioral change.  Class 1 obesity with serious comorbidity and body mass index (BMI) of 33.0 to 33.9 in adult, unspecified obesity type.  [name] is currently in the action stage of  change. As such, her goal is to continue with weight loss efforts. She has agreed to the Category 2 Plan. Protein substitution list was given.  Exercise goals: For substantial health benefits, adults should do at least 150 minutes (2 hours and 30 minutes) a week of moderate-intensity, or 75 minutes (1 hour and 15 minutes) a week of vigorous-intensity aerobic physical activity, or an equivalent combination of moderate- and vigorous-intensity aerobic activity. Aerobic activity should be performed in episodes of at least 10 minutes, and preferably, it should be spread throughout the week.  Behavioral modification strategies: increasing lean protein intake and meal planning and cooking strategies.  [name] has agreed to follow-up with our clinic in 2 weeks. She was informed of the importance of frequent follow-up visits to maximize her success with intensive lifestyle modifications for her multiple health conditions.   [name] was informed we would discuss her lab results at her next visit unless there is a critical issue that needs to be addressed sooner. [name] agreed to keep her next visit at the agreed upon time to discuss these results.  Objective:   Blood pressure (!) 150/80, pulse 62, temperature 98 F (36.7 C), temperature source Oral, height 5\' 2"  (1.575 m), weight 180 lb (81.6 kg), SpO2 98 %. Body mass index is 32.92 kg/m.  General: Cooperative, alert, well developed, in no acute distress. HEENT: Conjunctivae and lids unremarkable. Cardiovascular: Regular rhythm.  Lungs: Normal work of breathing. Neurologic: No focal deficits.   Lab Results  Component Value Date   CREATININE 0.91 09/10/2019   BUN 18 09/10/2019   NA 141 09/10/2019   K 4.2 09/10/2019   CL 104 09/10/2019   CO2 25 09/10/2019   Lab Results  Component Value Date   ALT 13 09/10/2019   AST 18 09/10/2019   ALKPHOS 101 09/10/2019   BILITOT 0.2 09/10/2019   Lab Results  Component Value Date   HGBA1C 6.0 (H) 09/10/2019     Lab Results  Component Value Date   INSULIN 7.2 09/10/2019   Lab Results  Component Value Date   TSH 1.430 09/10/2019   Lab Results  Component Value Date   CHOL 184 09/10/2019   HDL 47 09/10/2019   LDLCALC 121 (H) 09/10/2019   TRIG 88 09/10/2019   Lab Results  Component Value Date   WBC 7.5 09/10/2019   HGB 12.1 09/10/2019   HCT 37.2 09/10/2019   MCV 85 09/10/2019   PLT 283 09/10/2019   No results found for: IRON, TIBC, FERRITIN  Attestation Statements:   Reviewed by clinician on day of visit: allergies, medications, problem list, medical history, surgical history, family history, social history, and previous encounter notes.  IMichaelene Song, am acting as transcriptionist for Abby Potash, PA-C   I have reviewed the above documentation for accuracy and completeness, and I agree with the above. Abby Potash, PA-C

## 2020-01-09 LAB — COMPREHENSIVE METABOLIC PANEL
ALT: 12 IU/L (ref 0–32)
AST: 15 IU/L (ref 0–40)
Albumin/Globulin Ratio: 1.5 (ref 1.2–2.2)
Albumin: 4.3 g/dL (ref 3.8–4.9)
Alkaline Phosphatase: 97 IU/L (ref 39–117)
BUN/Creatinine Ratio: 16 (ref 9–23)
BUN: 18 mg/dL (ref 6–24)
Bilirubin Total: 0.3 mg/dL (ref 0.0–1.2)
CO2: 23 mmol/L (ref 20–29)
Calcium: 9.6 mg/dL (ref 8.7–10.2)
Chloride: 107 mmol/L — ABNORMAL HIGH (ref 96–106)
Creatinine, Ser: 1.11 mg/dL — ABNORMAL HIGH (ref 0.57–1.00)
GFR calc Af Amer: 64 mL/min/{1.73_m2} (ref >59)
GFR calc non Af Amer: 55 mL/min/{1.73_m2} — ABNORMAL LOW (ref >59)
Globulin, Total: 2.9 g/dL (ref 1.5–4.5)
Glucose: 94 mg/dL (ref 65–99)
Potassium: 4.4 mmol/L (ref 3.5–5.2)
Sodium: 143 mmol/L (ref 134–144)
Total Protein: 7.2 g/dL (ref 6.0–8.5)

## 2020-01-09 LAB — INSULIN, RANDOM: INSULIN: 11.4 u[IU]/mL (ref 2.6–24.9)

## 2020-01-09 LAB — HEMOGLOBIN A1C
Est. average glucose Bld gHb Est-mCnc: 126 mg/dL
Hgb A1c MFr Bld: 6 % — ABNORMAL HIGH (ref 4.8–5.6)

## 2020-01-09 LAB — LIPID PANEL WITH LDL/HDL RATIO
Cholesterol, Total: 195 mg/dL (ref 100–199)
HDL: 47 mg/dL (ref >39)
LDL Chol Calc (NIH): 133 mg/dL — ABNORMAL HIGH (ref 0–99)
LDL/HDL Ratio: 2.8 ratio (ref 0.0–3.2)
Triglycerides: 82 mg/dL (ref 0–149)
VLDL Cholesterol Cal: 15 mg/dL (ref 5–40)

## 2020-01-09 LAB — VITAMIN D 25 HYDROXY (VIT D DEFICIENCY, FRACTURES): Vit D, 25-Hydroxy: 76.9 ng/mL (ref 30.0–100.0)

## 2020-01-22 ENCOUNTER — Ambulatory Visit (INDEPENDENT_AMBULATORY_CARE_PROVIDER_SITE_OTHER): Payer: BC Managed Care – PPO | Admitting: Physician Assistant

## 2020-02-04 ENCOUNTER — Encounter (INDEPENDENT_AMBULATORY_CARE_PROVIDER_SITE_OTHER): Payer: Self-pay | Admitting: Physician Assistant

## 2020-02-04 ENCOUNTER — Other Ambulatory Visit: Payer: Self-pay

## 2020-02-04 ENCOUNTER — Ambulatory Visit (INDEPENDENT_AMBULATORY_CARE_PROVIDER_SITE_OTHER): Payer: BC Managed Care – PPO | Admitting: Physician Assistant

## 2020-02-04 VITALS — BP 132/76 | HR 81 | Temp 98.2°F | Ht 62.0 in | Wt 177.0 lb

## 2020-02-04 DIAGNOSIS — E559 Vitamin D deficiency, unspecified: Secondary | ICD-10-CM | POA: Diagnosis not present

## 2020-02-04 DIAGNOSIS — Z6832 Body mass index (BMI) 32.0-32.9, adult: Secondary | ICD-10-CM | POA: Diagnosis not present

## 2020-02-04 DIAGNOSIS — E669 Obesity, unspecified: Secondary | ICD-10-CM

## 2020-02-04 NOTE — Progress Notes (Signed)
Chief Complaint:   OBESITY [name] is here to discuss her progress with her obesity treatment plan along with follow-up of her obesity related diagnoses. [name] is on the Category 2 Plan and states she is following her eating plan approximately 75-80% of the time. [name] states she is walking 15 minutes 5 times per week.  Today's visit was #: 8 Starting weight: 184 lbs Starting date: 09/10/2019 Today's weight: 177 lbs Today's date: 02/04/2020 Total lbs lost to date: 7 Total lbs lost since last in-office visit: 3  Interim History: [name] is enjoying the plan, but states that she is not drinking enough water. She wants to start exercising more often.  Subjective:   Vitamin D deficiency. [name] is no longer on Vitamin D weekly. Last Vitamin D level was at goal - 76.9 on 01/08/2020.  Assessment/Plan:   Vitamin D deficiency. Low Vitamin D level contributes to fatigue and are associated with obesity, breast, and colon cancer. She agrees to start taking OTC Vitamin D 1,000 units daily and will follow-up for routine testing of Vitamin D, at least 2-3 times per year to avoid over-replacement.  Class 1 obesity with serious comorbidity and body mass index (BMI) of 32.0 to 32.9 in adult, unspecified obesity type.  [name] is currently in the action stage of change. As such, her goal is to continue with weight loss efforts. She has agreed to the Category 2 Plan.   Exercise goals: For substantial health benefits, adults should do at least 150 minutes (2 hours and 30 minutes) a week of moderate-intensity, or 75 minutes (1 hour and 15 minutes) a week of vigorous-intensity aerobic physical activity, or an equivalent combination of moderate- and vigorous-intensity aerobic activity. Aerobic activity should be performed in episodes of at least 10 minutes, and preferably, it should be spread throughout the week.  Behavioral modification strategies: increasing water intake and meal planning and cooking  strategies.  [name] has agreed to follow-up with our clinic in 3 weeks. She was informed of the importance of frequent follow-up visits to maximize her success with intensive lifestyle modifications for her multiple health conditions.   Objective:   Blood pressure 132/76, pulse 81, temperature 98.2 F (36.8 C), temperature source Oral, height 5\' 2"  (1.575 m), weight 177 lb (80.3 kg), SpO2 99 %. Body mass index is 32.37 kg/m.  General: Cooperative, alert, well developed, in no acute distress. HEENT: Conjunctivae and lids unremarkable. Cardiovascular: Regular rhythm.  Lungs: Normal work of breathing. Neurologic: No focal deficits.   Lab Results  Component Value Date   CREATININE 1.11 (H) 01/08/2020   BUN 18 01/08/2020   NA 143 01/08/2020   K 4.4 01/08/2020   CL 107 (H) 01/08/2020   CO2 23 01/08/2020   Lab Results  Component Value Date   ALT 12 01/08/2020   AST 15 01/08/2020   ALKPHOS 97 01/08/2020   BILITOT 0.3 01/08/2020   Lab Results  Component Value Date   HGBA1C 6.0 (H) 01/08/2020   HGBA1C 6.0 (H) 09/10/2019   Lab Results  Component Value Date   INSULIN 11.4 01/08/2020   INSULIN 7.2 09/10/2019   Lab Results  Component Value Date   TSH 1.430 09/10/2019   Lab Results  Component Value Date   CHOL 195 01/08/2020   HDL 47 01/08/2020   LDLCALC 133 (H) 01/08/2020   TRIG 82 01/08/2020   Lab Results  Component Value Date   WBC 7.5 09/10/2019   HGB 12.1 09/10/2019   HCT  37.2 09/10/2019   MCV 85 09/10/2019   PLT 283 09/10/2019   No results found for: IRON, TIBC, FERRITIN  Attestation Statements:   Reviewed by clinician on day of visit: allergies, medications, problem list, medical history, surgical history, family history, social history, and previous encounter notes.  Time spent on visit including pre-visit chart review and post-visit charting and care was 32 minutes.   IMichaelene Song, am acting as transcriptionist for Abby Potash, PA-C   I have  reviewed the above documentation for accuracy and completeness, and I agree with the above. Abby Potash, PA-C

## 2020-02-24 ENCOUNTER — Other Ambulatory Visit: Payer: Self-pay

## 2020-02-24 ENCOUNTER — Encounter (INDEPENDENT_AMBULATORY_CARE_PROVIDER_SITE_OTHER): Payer: Self-pay | Admitting: Physician Assistant

## 2020-02-24 ENCOUNTER — Ambulatory Visit (INDEPENDENT_AMBULATORY_CARE_PROVIDER_SITE_OTHER): Payer: BC Managed Care – PPO | Admitting: Physician Assistant

## 2020-02-24 VITALS — BP 145/76 | HR 63 | Temp 98.6°F | Ht 62.0 in | Wt 177.0 lb

## 2020-02-24 DIAGNOSIS — E669 Obesity, unspecified: Secondary | ICD-10-CM | POA: Diagnosis not present

## 2020-02-24 DIAGNOSIS — E7849 Other hyperlipidemia: Secondary | ICD-10-CM

## 2020-02-24 DIAGNOSIS — Z9189 Other specified personal risk factors, not elsewhere classified: Secondary | ICD-10-CM

## 2020-02-24 DIAGNOSIS — E559 Vitamin D deficiency, unspecified: Secondary | ICD-10-CM

## 2020-02-24 DIAGNOSIS — Z6832 Body mass index (BMI) 32.0-32.9, adult: Secondary | ICD-10-CM

## 2020-02-24 MED ORDER — VITAMIN D (ERGOCALCIFEROL) 1.25 MG (50000 UNIT) PO CAPS: 50000.0000 [IU] | ORAL_CAPSULE | ORAL | 0 refills | Status: AC

## 2020-02-24 NOTE — Progress Notes (Signed)
Chief Complaint:   OBESITY [name] is here to discuss her progress with her obesity treatment plan along with follow-up of her obesity related diagnoses. [name] is on the Category 2 Plan and states she is following her eating plan approximately 70% of the time. [name] states she is exercising 0 minutes 0 times per week.  Today's visit was #: 9 Starting weight: 184 lbs Starting date: 09/10/2019 Today's weight: 177 lbs Today's date: 02/24/2020 Total lbs lost to date: 7 Total lbs lost since last in-office visit: 0  Interim History: [name] reports that she had [employer] last week and overindulged. She is asking about good snack choices.  Subjective:   Other hyperlipidemia. [name] is on no medications. No chest pain.  Lab Results  Component Value Date   CHOL 195 01/08/2020   HDL 47 01/08/2020   LDLCALC 133 (H) 01/08/2020   TRIG 82 01/08/2020   Lab Results  Component Value Date   ALT 12 01/08/2020   AST 15 01/08/2020   ALKPHOS 97 01/08/2020   BILITOT 0.3 01/08/2020   The 10-year ASCVD risk score Mikey Bussing DC Jr., et al., 2013) is: 9.6%   Values used to calculate the score:     Age: 59 years     Sex: Female     Is Non-Hispanic African American: Yes     Diabetic: No     Tobacco smoker: No     Systolic Blood Pressure: Q000111Q mmHg     Is BP treated: Yes     HDL Cholesterol: 47 mg/dL     Total Cholesterol: 195 mg/dL  Vitamin D deficiency. No nausea, vomiting, or muscle weakness on Vitamin D. Last Vitamin D 76.9 on 01/08/2020.  Assessment/Plan:   Other hyperlipidemia. Cardiovascular risk and specific lipid/LDL goals reviewed.  We discussed several lifestyle modifications today and [name] will continue to work on diet, exercise and weight loss efforts. Orders and follow up as documented in patient record.   Counseling Intensive lifestyle modifications are the first line treatment for this issue. . Dietary changes: Increase soluble fiber. Decrease simple  carbohydrates. . Exercise changes: Moderate to vigorous-intensity aerobic activity 150 minutes per week if tolerated. . Lipid-lowering medications: see documented in medical record.  Vitamin D deficiency. Low Vitamin D level contributes to fatigue and are associated with obesity, breast, and colon cancer. She was given a refill on her Vitamin D, Ergocalciferol, (DRISDOL) 1.25 MG (50000 UNIT) CAPS capsule. every week #4 with 0 refills and will follow-up for routine testing of Vitamin D, at least 2-3 times per year to avoid over-replacement.    Class 1 obesity with serious comorbidity and body mass index (BMI) of 32.0 to 32.9 in adult, unspecified obesity type.  [name] is currently in the action stage of change. As such, her goal is to continue with weight loss efforts. She has agreed to the Category 2 Plan.   Exercise goals: For substantial health benefits, adults should do at least 150 minutes (2 hours and 30 minutes) a week of moderate-intensity, or 75 minutes (1 hour and 15 minutes) a week of vigorous-intensity aerobic physical activity, or an equivalent combination of moderate- and vigorous-intensity aerobic activity. Aerobic activity should be performed in episodes of at least 10 minutes, and preferably, it should be spread throughout the week.  Behavioral modification strategies: meal planning and cooking strategies and better snacking choices.  [name] has agreed to follow-up with our clinic in 3 weeks. She was informed of the importance of  frequent follow-up visits to maximize her success with intensive lifestyle modifications for her multiple health conditions.   Objective:   Blood pressure (!) 145/76, pulse 63, temperature 98.6 F (37 C), temperature source Oral, height 5\' 2"  (1.575 m), weight 177 lb (80.3 kg), SpO2 100 %. Body mass index is 32.37 kg/m.  General: Cooperative, alert, well developed, in no acute distress. HEENT: Conjunctivae and lids unremarkable. Cardiovascular: Regular  rhythm.  Lungs: Normal work of breathing. Neurologic: No focal deficits.   Lab Results  Component Value Date   CREATININE 1.11 (H) 01/08/2020   BUN 18 01/08/2020   NA 143 01/08/2020   K 4.4 01/08/2020   CL 107 (H) 01/08/2020   CO2 23 01/08/2020   Lab Results  Component Value Date   ALT 12 01/08/2020   AST 15 01/08/2020   ALKPHOS 97 01/08/2020   BILITOT 0.3 01/08/2020   Lab Results  Component Value Date   HGBA1C 6.0 (H) 01/08/2020   HGBA1C 6.0 (H) 09/10/2019   Lab Results  Component Value Date   INSULIN 11.4 01/08/2020   INSULIN 7.2 09/10/2019   Lab Results  Component Value Date   TSH 1.430 09/10/2019   Lab Results  Component Value Date   CHOL 195 01/08/2020   HDL 47 01/08/2020   LDLCALC 133 (H) 01/08/2020   TRIG 82 01/08/2020   Lab Results  Component Value Date   WBC 7.5 09/10/2019   HGB 12.1 09/10/2019   HCT 37.2 09/10/2019   MCV 85 09/10/2019   PLT 283 09/10/2019   No results found for: IRON, TIBC, FERRITIN  Attestation Statements:   Reviewed by clinician on day of visit: allergies, medications, problem list, medical history, surgical history, family history, social history, and previous encounter notes.  IMichaelene Song, am acting as transcriptionist for Abby Potash, PA-C   I have reviewed the above documentation for accuracy and completeness, and I agree with the above. Abby Potash, PA-C

## 2020-03-18 ENCOUNTER — Ambulatory Visit (INDEPENDENT_AMBULATORY_CARE_PROVIDER_SITE_OTHER): Payer: BC Managed Care – PPO | Admitting: Physician Assistant

## 2020-03-18 ENCOUNTER — Other Ambulatory Visit: Payer: Self-pay

## 2020-03-18 ENCOUNTER — Encounter (INDEPENDENT_AMBULATORY_CARE_PROVIDER_SITE_OTHER): Payer: Self-pay | Admitting: Physician Assistant

## 2020-03-18 VITALS — BP 153/81 | HR 55 | Temp 98.2°F | Ht 62.0 in | Wt 179.0 lb

## 2020-03-18 DIAGNOSIS — E669 Obesity, unspecified: Secondary | ICD-10-CM | POA: Diagnosis not present

## 2020-03-18 DIAGNOSIS — Z6832 Body mass index (BMI) 32.0-32.9, adult: Secondary | ICD-10-CM | POA: Diagnosis not present

## 2020-03-18 DIAGNOSIS — E559 Vitamin D deficiency, unspecified: Secondary | ICD-10-CM

## 2020-03-18 DIAGNOSIS — I10 Essential (primary) hypertension: Secondary | ICD-10-CM

## 2020-03-19 NOTE — Progress Notes (Signed)
Chief Complaint:   OBESITY [name] is here to discuss her progress with her obesity treatment plan along with follow-up of her obesity related diagnoses. Margie is on the Category 2 Plan and states she is following her eating plan approximately 75% of the time. Calinda states she is walking more.   Today's visit was #: 10 Starting weight: 184 lbs Starting date: 09/10/2019 Today's weight: 179 lbs Today's date: 03/18/2020 Total lbs lost to date: 5 Total lbs lost since last in-office visit: 0  Interim History: Kamberlyn reports that she has been eating a late breakfast and then skipping lunch. She is not drinking enough water.  Subjective:   Vitamin D deficiency. Seeley is on Vitamin D supplementation. No nausea, vomiting, or muscle weakness. She is walking outside during the week. Last Vitamin D was 76.9 on 01/08/2020.  Essential hypertension. Crystl is on spironolactone, but states she has not been taking it daily as she has been constipated. No headache or chest pain. Blood pressure is elevated today.   BP Readings from Last 3 Encounters:  03/18/20 (!) 153/81  02/24/20 (!) 145/76  02/04/20 132/76   Lab Results  Component Value Date   CREATININE 1.11 (H) 01/08/2020   CREATININE 0.91 09/10/2019    Assessment/Plan:   Vitamin D deficiency. Low Vitamin D level contributes to fatigue and are associated with obesity, breast, and colon cancer. She agrees to continue to take Vitamin D and will follow-up for routine testing of Vitamin D, at least 2-3 times per year to avoid over-replacement.  Essential hypertension. Sanovia is working on healthy weight loss and exercise to improve blood pressure control. We will watch for signs of hypotension as she continues her lifestyle modifications. She was instructed to take spironolactone daily as prescribed.  Class 1 obesity with serious comorbidity and body mass index (BMI) of 32.0 to 32.9 in adult, unspecified obesity type.  Devanie is  currently in the action stage of change. As such, her goal is to continue with weight loss efforts. She has agreed to the Category 2 Plan.   Exercise goals: For substantial health benefits, adults should do at least 150 minutes (2 hours and 30 minutes) a week of moderate-intensity, or 75 minutes (1 hour and 15 minutes) a week of vigorous-intensity aerobic physical activity, or an equivalent combination of moderate- and vigorous-intensity aerobic activity. Aerobic activity should be performed in episodes of at least 10 minutes, and preferably, it should be spread throughout the week.  Behavioral modification strategies: increasing lean protein intake and no skipping meals.  Darlinda has agreed to follow-up with our clinic in 3 weeks. She was informed of the importance of frequent follow-up visits to maximize her success with intensive lifestyle modifications for her multiple health conditions.   Objective:   Blood pressure (!) 153/81, pulse (!) 55, temperature 98.2 F (36.8 C), temperature source Oral, height 5\' 2"  (1.575 m), weight 179 lb (81.2 kg), SpO2 100 %. Body mass index is 32.74 kg/m.  General: Cooperative, alert, well developed, in no acute distress. HEENT: Conjunctivae and lids unremarkable. Cardiovascular: Regular rhythm.  Lungs: Normal work of breathing. Neurologic: No focal deficits.   Lab Results  Component Value Date   CREATININE 1.11 (H) 01/08/2020   BUN 18 01/08/2020   NA 143 01/08/2020   K 4.4 01/08/2020   CL 107 (H) 01/08/2020   CO2 23 01/08/2020   Lab Results  Component Value Date   ALT 12 01/08/2020   AST 15 01/08/2020  ALKPHOS 97 01/08/2020   BILITOT 0.3 01/08/2020   Lab Results  Component Value Date   HGBA1C 6.0 (H) 01/08/2020   HGBA1C 6.0 (H) 09/10/2019   Lab Results  Component Value Date   INSULIN 11.4 01/08/2020   INSULIN 7.2 09/10/2019   Lab Results  Component Value Date   TSH 1.430 09/10/2019   Lab Results  Component Value Date   CHOL 195  01/08/2020   HDL 47 01/08/2020   LDLCALC 133 (H) 01/08/2020   TRIG 82 01/08/2020   Lab Results  Component Value Date   WBC 7.5 09/10/2019   HGB 12.1 09/10/2019   HCT 37.2 09/10/2019   MCV 85 09/10/2019   PLT 283 09/10/2019   No results found for: IRON, TIBC, FERRITIN  Attestation Statements:   Reviewed by clinician on day of visit: allergies, medications, problem list, medical history, surgical history, family history, social history, and previous encounter notes.  Time spent on visit including pre-visit chart review and post-visit charting and care was 30 minutes.   IMichaelene Song, am acting as transcriptionist for Abby Potash, PA-C   I have reviewed the above documentation for accuracy and completeness, and I agree with the above. Abby Potash, PA-C

## 2020-04-08 ENCOUNTER — Ambulatory Visit (INDEPENDENT_AMBULATORY_CARE_PROVIDER_SITE_OTHER): Payer: BC Managed Care – PPO | Admitting: Physician Assistant

## 2020-05-15 ENCOUNTER — Ambulatory Visit: Payer: Self-pay

## 2020-05-15 ENCOUNTER — Ambulatory Visit: Payer: BC Managed Care – PPO | Admitting: Orthopedic Surgery

## 2020-05-15 DIAGNOSIS — M7750 Other enthesopathy of unspecified foot: Secondary | ICD-10-CM | POA: Diagnosis not present

## 2020-05-15 DIAGNOSIS — M25511 Pain in right shoulder: Secondary | ICD-10-CM | POA: Diagnosis not present

## 2020-05-16 ENCOUNTER — Encounter: Payer: Self-pay | Admitting: Orthopedic Surgery

## 2020-05-16 NOTE — Progress Notes (Signed)
Office Visit Note   Patient: [name]           Date of Birth: May 17, 1961           MRN: [id number] Visit Date: 05/15/2020 Requested by: Delsa Bern, MD [address] [place],   [id number] PCP: Delsa Bern, MD  Subjective: Chief Complaint  Patient presents with  . Right Shoulder - Pain  . Left Foot - Pain    HPI: Delight is a patient with right shoulder pain and left medial foot pain. The left foot has been hurting for several months. Denies any history of injury. Localizes the pain to the medial aspect of the foot. She has been doing some stretching. Does not take much in terms of medication for the problem.  Patient also describes right shoulder pain several months duration. Started after she was doing aerobic exercise at the gym. Localizes it superiorly to the [facility] joint as well as laterally in the deltoid region. Has some strain when she raises her arm. Has occasional radicular symptoms 1 episode of numbness and tingling in the right arm. She is right-hand dominant. Overall the right shoulder is improving. Hurts for her to lay on her stomach. Hurts most with abduction of the shoulder.              ROS: All systems reviewed are negative as they relate to the chief complaint within the history of present illness.  Patient denies  fevers or chills.   Assessment & Plan: Visit Diagnoses:  1. Right shoulder pain, unspecified chronicity   2. Tendinitis of foot     Plan: Impression is right shoulder bursitis with normal radiographs and excellent rotator cuff strength. I think this is something we could watch. I think below shoulder level rotator cuff strengthening exercises would be helpful. No indication for injection at this time. I also think anti-inflammatories could be helpful on a sporadic but not daily basis.  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 I think that could be mildly  symptomatic. Asymmetric when compared to the right-hand side. Plantar aspect of the foot is nontender so this does not look like plantar fasciitis. I think that something that she wants to manage for now. She does have a little bit of pronation and collapse of the midfoot. Excellent posterior tib strength however.  Follow-Up Instructions: Return if symptoms worsen or fail to improve.   Orders:  Orders Placed This Encounter  Procedures  . XR Shoulder Right   No orders of the defined types were placed in this encounter.     Procedures: No procedures performed   Clinical Data: No additional findings.  Objective: Vital Signs: There were no vitals taken for this visit.  Physical Exam:   Constitutional: Patient appears well-developed HEENT:  Head: Normocephalic Eyes:EOM are normal Neck: Normal range of motion Cardiovascular: Normal rate Pulmonary/chest: Effort normal Neurologic: Patient is alert Skin: Skin is warm Psychiatric: Patient has normal mood and affect    Ortho Exam: Ortho exam demonstrates full active and passive range of motion of both shoulders. Excellent rotator cuff strength on the right infraspinatus supraspinatus and subscap muscle testing. No masses lymphadenopathy or skin changes noted in the shoulder girdle region. No real asymmetric tenderness right versus left AC joint. Cervical spine range of motion full. Motor or sensory function in the arm is intact. Left foot is examined. Patient has mild collapse of the midfoot when  standing. She does have appropriate heel inversion when standing on her toes. Palpable intact nontender anterior to posterior to peroneal and Achilles tendons with excellent inversion strength bilaterally. No pain with pronation supination of the forefoot negative squeeze test on the calcaneus and no pain on the plantar aspect of the calcaneus.  Specialty Comments:  No specialty comments available.  Imaging: No results found.   PMFS  History: Patient Active Problem List   Diagnosis Date Noted  . Hidradenitis 09/10/2019  . Menopausal flushing 03/16/2018  . Vitamin D deficiency 03/16/2018  . 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 Cystic acne 05/04/2016  . Hypertension 05/04/2016  . Plantar fasciitis of left foot 05/04/2016  . Varicose veins of both lower extremities 05/04/2016  . Anemia    Past Medical History:  Diagnosis Date  . Abscess of skin 2007  . Acne   . Anemia   . Bilateral swelling of feet and ankles   . Breast abscess 2007  . Breast mass   . Cellulitis 2003  . Constipation   . Fibroid   . Fibroids, submucosal 2008  . H/O: menorrhagia 2008  . Heart murmur   . Hx: UTI (urinary tract infection)   . Joint pain   . Obesity 2010    Family History  Problem Relation Age of Onset  . Hypertension Mother   . Diabetes Mother   . Diabetes Maternal Grandmother   . Hypertension Maternal Grandmother   . Diabetes Maternal Grandfather   . Hypertension Maternal Grandfather   . Cancer Father   . Depression Father     Past Surgical History:  Procedure Laterality Date  . ABDOMINAL HYSTERECTOMY    . TUBAL LIGATION     Social History   Occupational History  . Not on file  Tobacco Use  . Smoking status: Never Smoker  . Smokeless tobacco: Never Used  Substance and Sexual Activity  . Alcohol use: No  . Drug use: No  . Sexual activity: Yes    Birth control/protection: Surgical    Comment: hysterectomy

## 2020-06-14 IMAGING — US US BREAST CYST ASPIRATION 1ST CYST
1 series · 5 of 5 positions shown · non-contrast
Comparison: Previous exams.

CLINICAL DATA: Aspiration of 2 left breast cysts.

EXAM:
ULTRASOUND GUIDED LEFT BREAST CYST ASPIRATIONS

[Series 1: us breast cyst aspiration 1st cyst · 0.09mm/px · 5 of 5 slices shown]
[im 1/5]
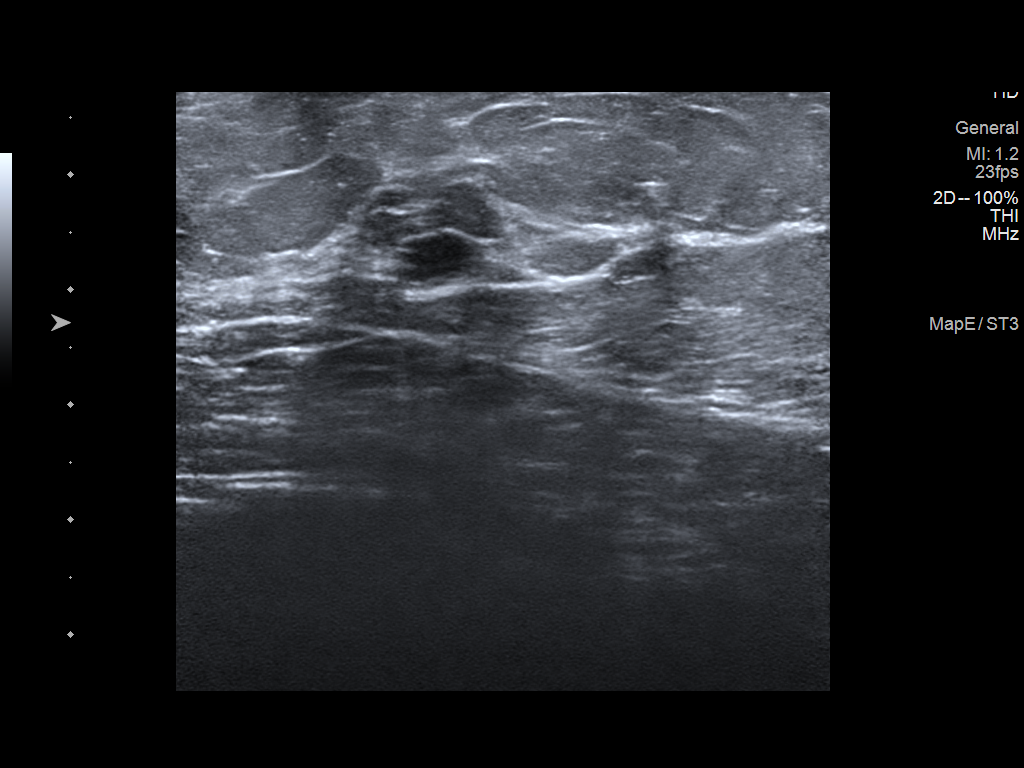
[im 2/5]
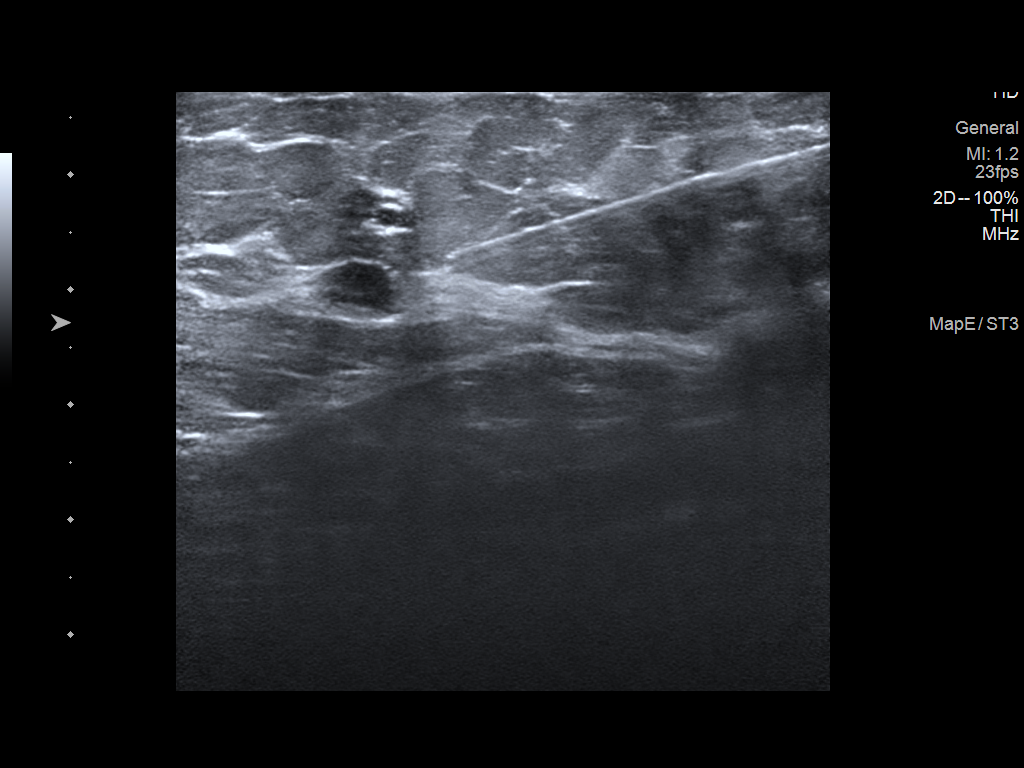
[im 3/5]
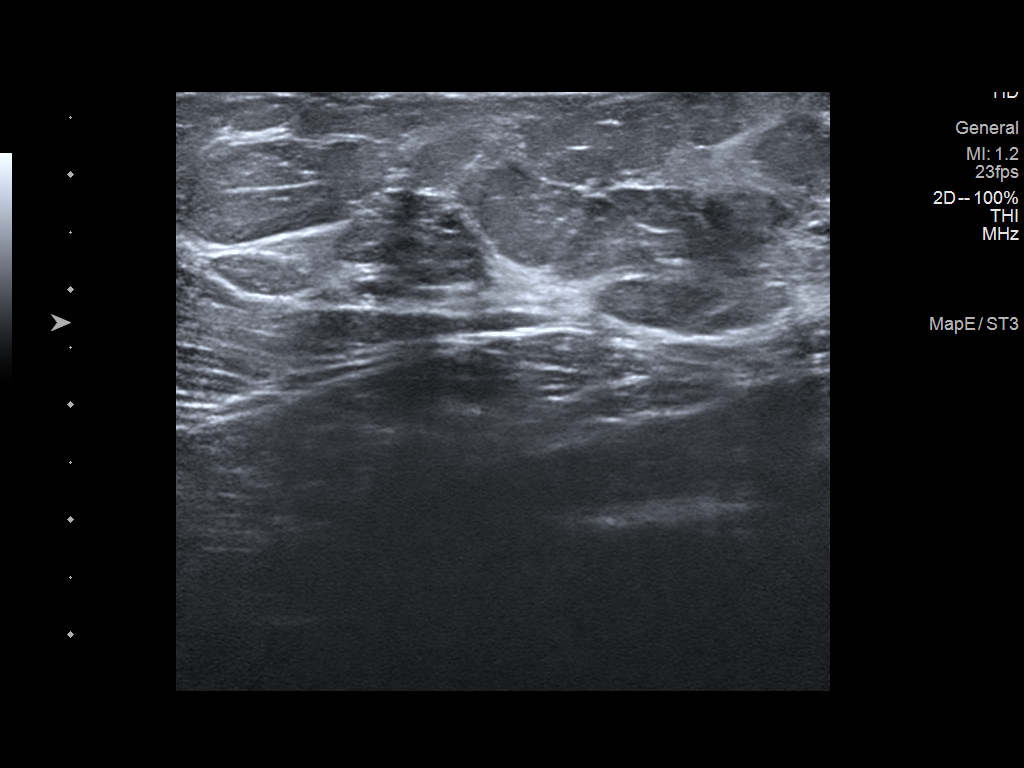
[im 4/5]
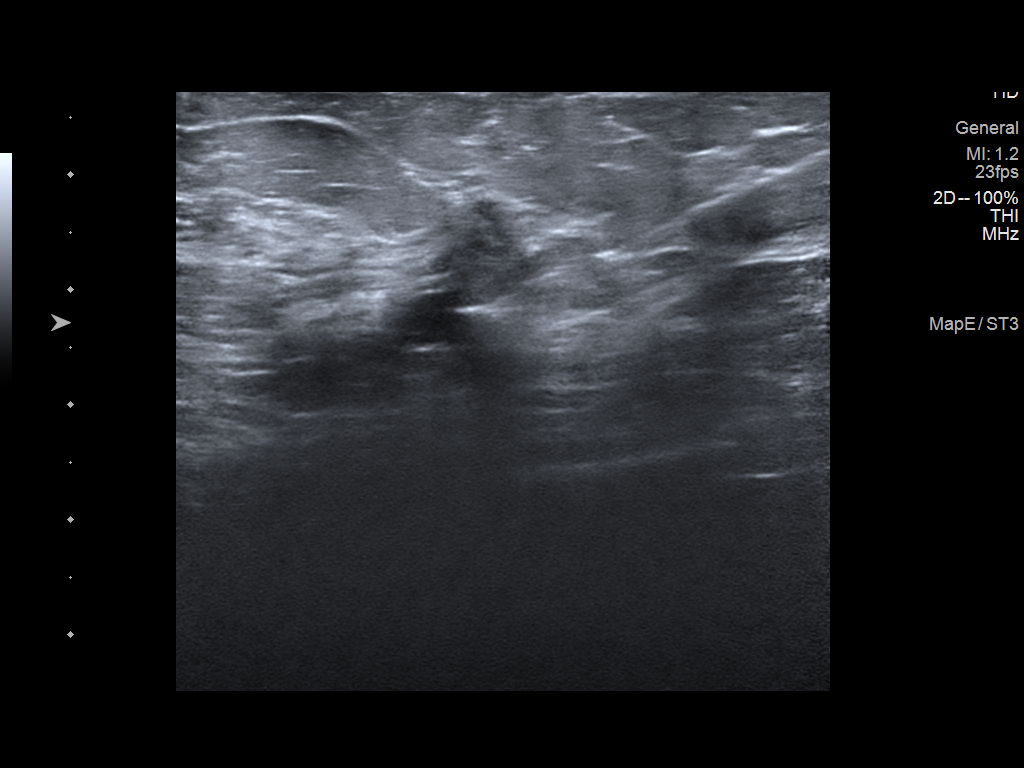
[im 5/5]
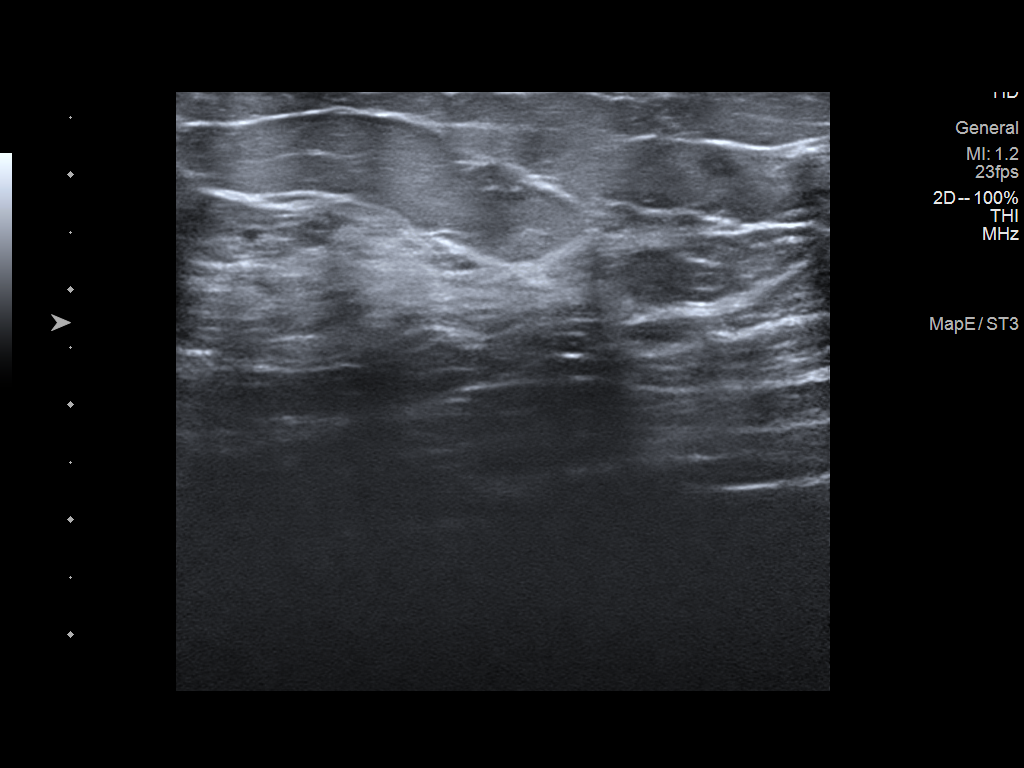

[5 of 5 positions shown; findings below may reference images not displayed]

PROCEDURE:
Using sterile technique, 1% lidocaine, under direct ultrasound
visualization, needle aspiration of 2 LEFT BREAST CYSTS was
performed. I believe the cyst at 2 o'clock, 9 cm from the nipple
likely correlates with the outside imaging findings. However, it is
possible the cyst at 3 o'clock correlates. As a result, both cysts
were aspirated.
IMPRESSION: Ultrasound-guided aspiration of 2 left breast cysts no apparent
complications.

RECOMMENDATIONS:
Recommend returning to annual screening mammography.

## 2020-06-14 IMAGING — MG MM BREAST LOCALIZATION CLIP
4 series · 4 of 12 positions shown · non-contrast
Comparison: Previous exam(s).

CLINICAL DATA: Follow-up cyst aspiration.

EXAM:
DIAGNOSTIC LEFT MAMMOGRAM POST ULTRASOUND GUIDED CYST ASPIRATION

[L CC synth-2D]
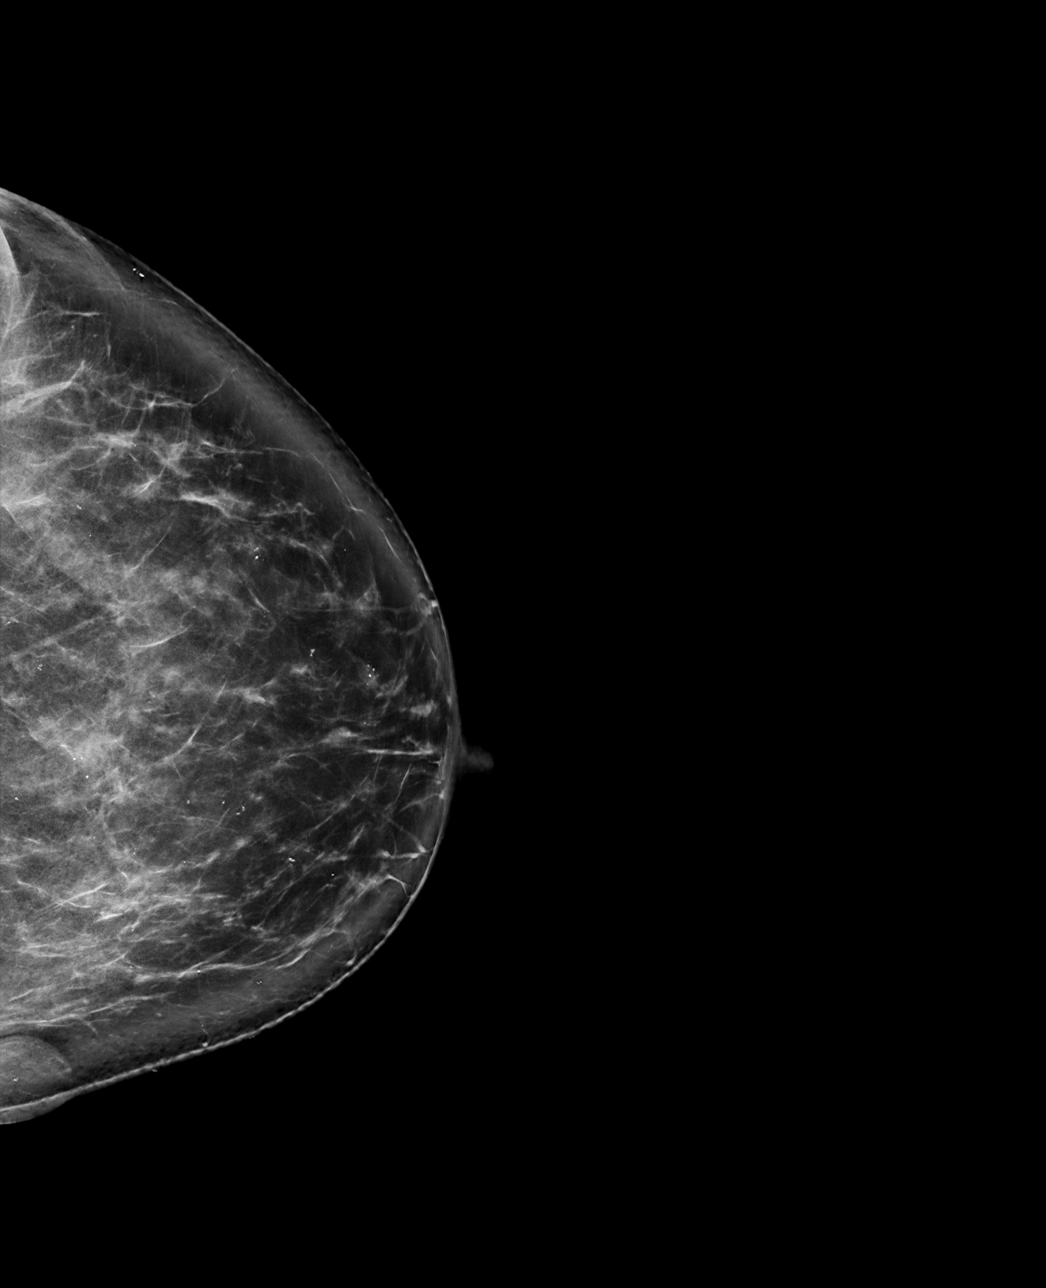

[L ML synth-2D]
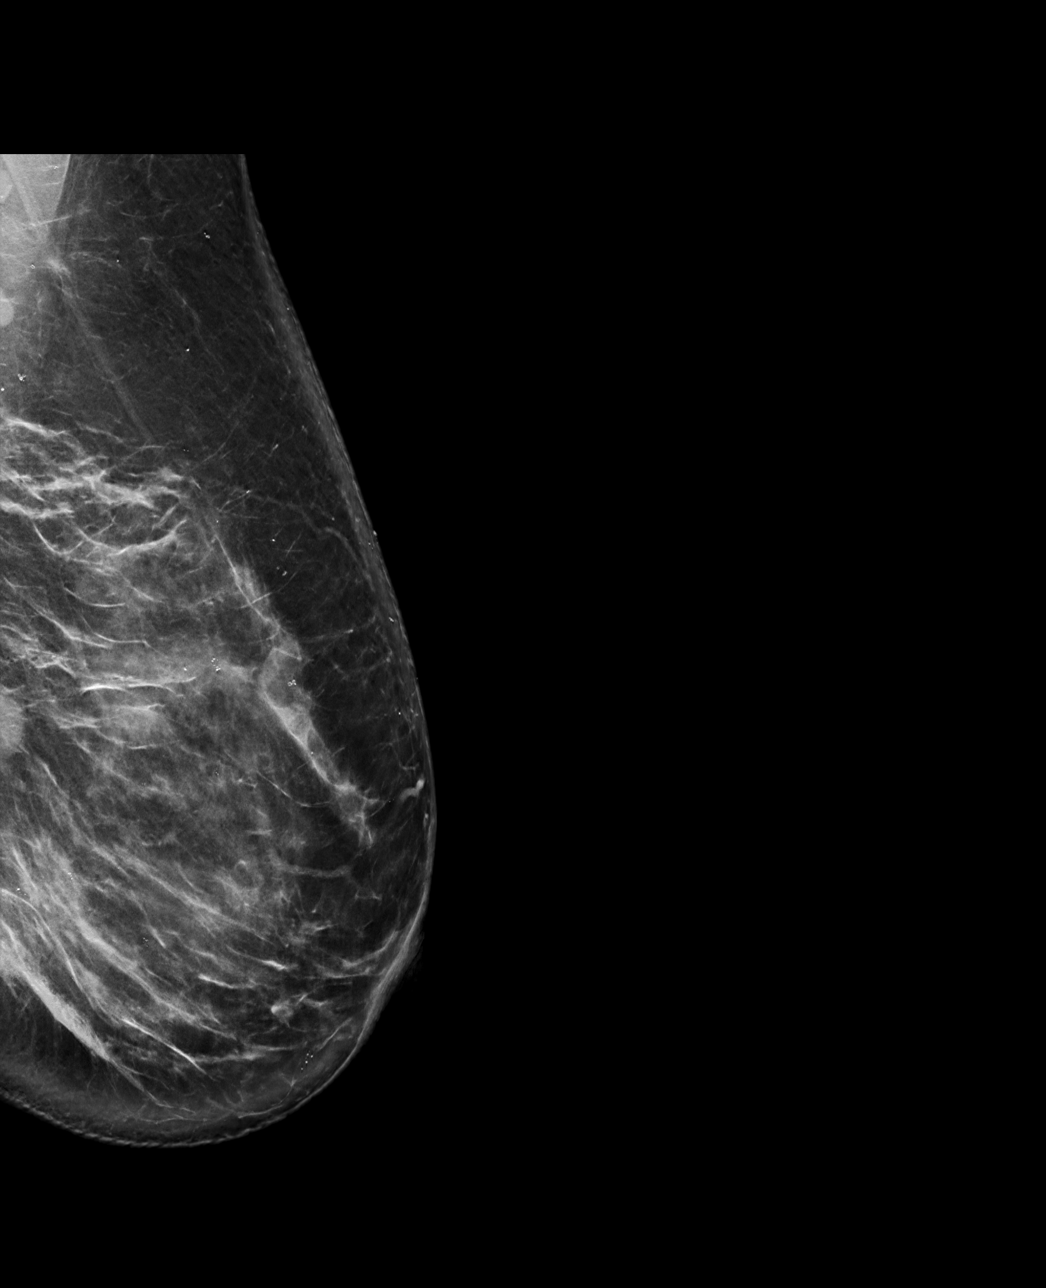

[L ML tomo · tomo slice 49/98.0]
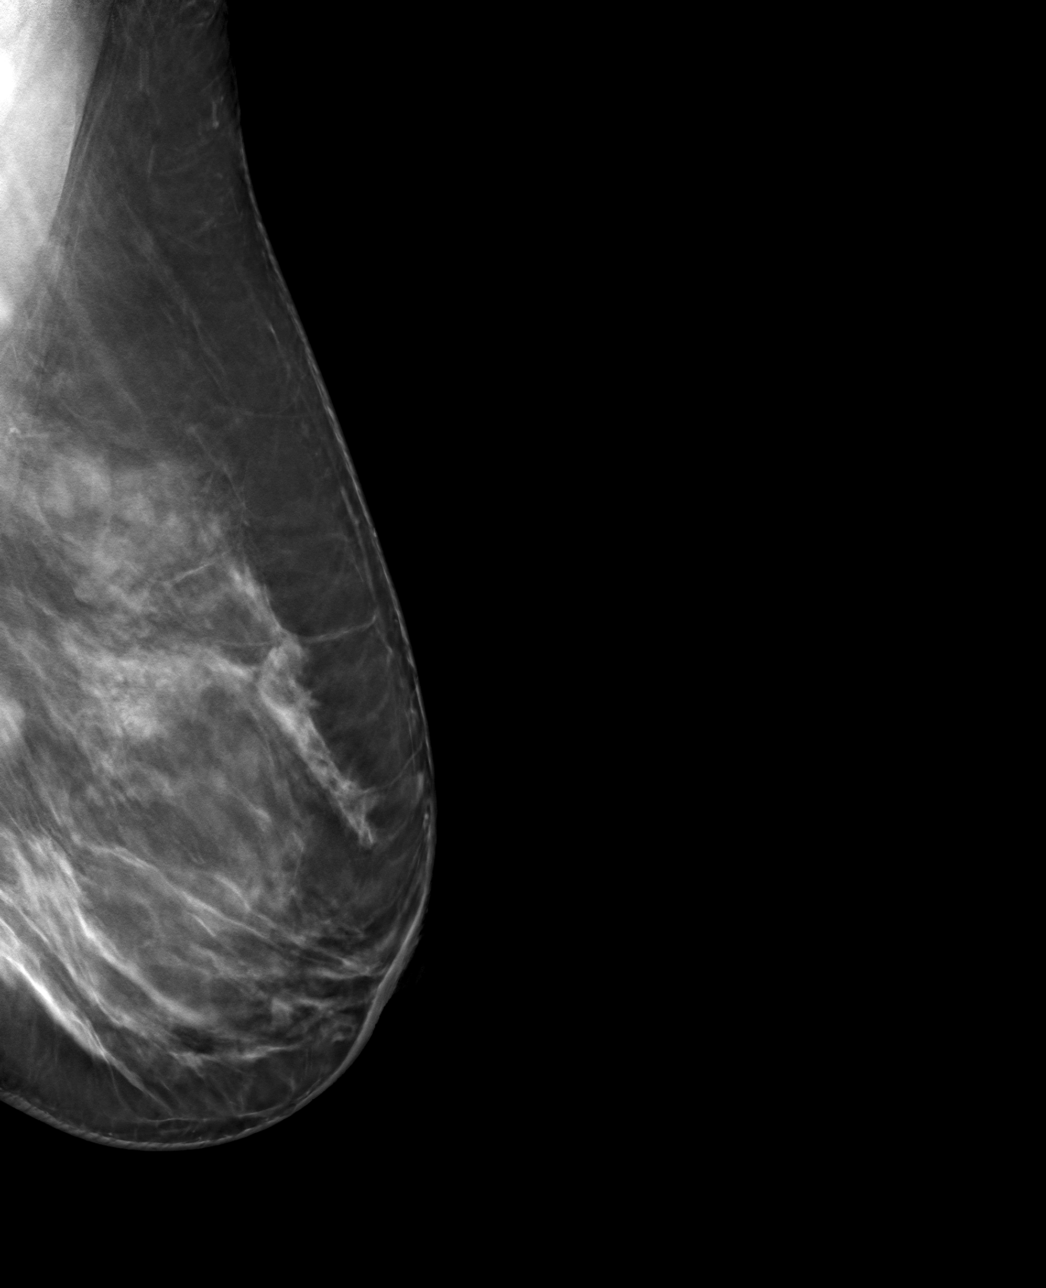

[L CC tomo · tomo slice 47/93.0]
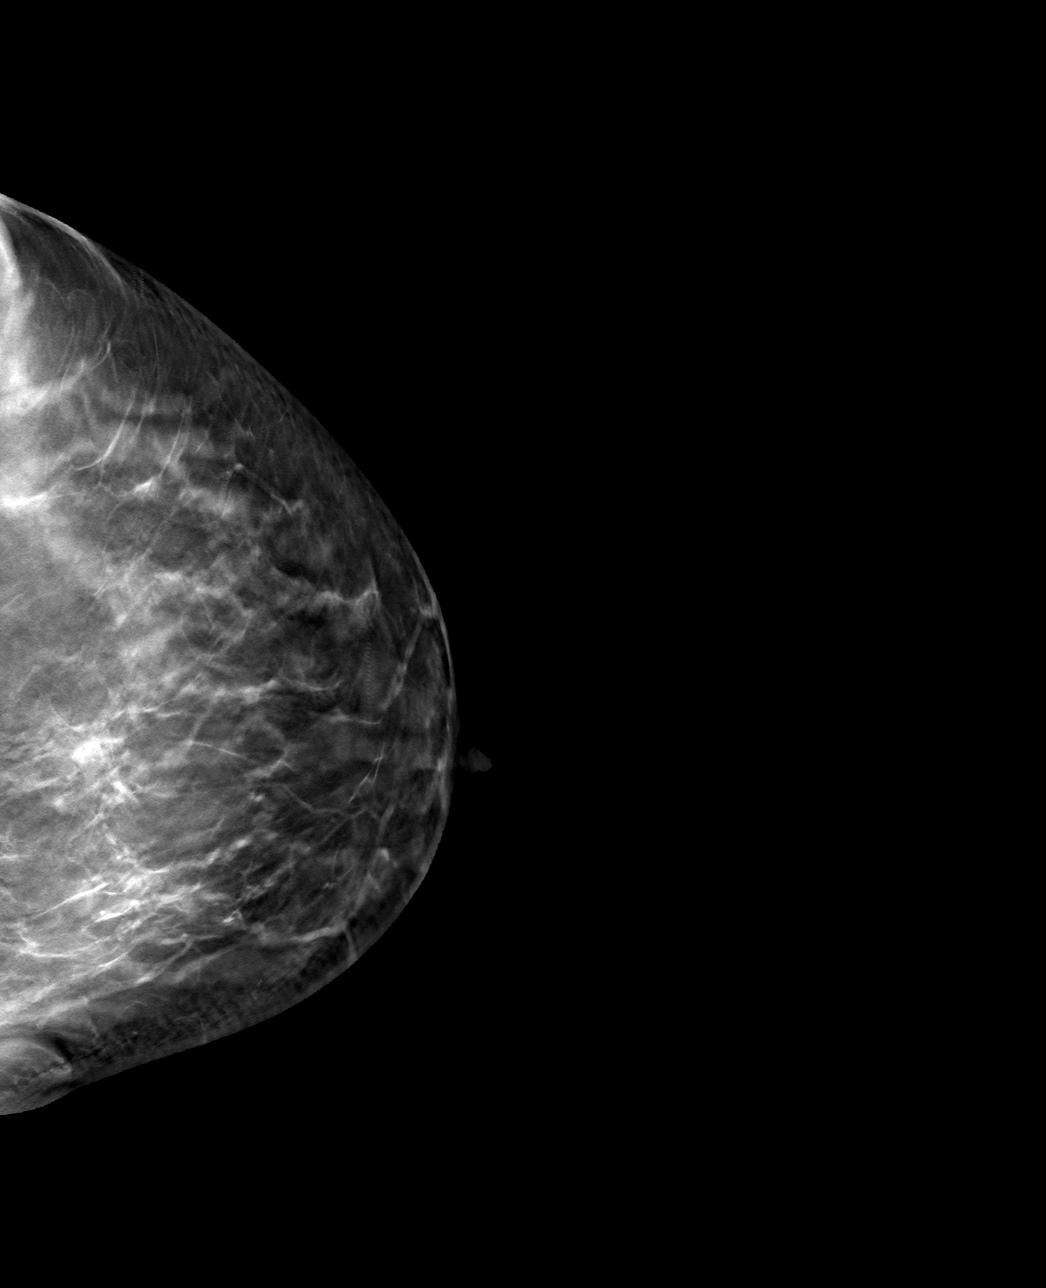

[4 of 12 positions shown; findings below may reference images not displayed]

FINDINGS: Mammographic images were obtained following ultrasound guided
aspiration of 2 left breast cysts. The mass seen on screening and
diagnostic mammography has resolved, correlating with 1 of the
aspirated cysts.
IMPRESSION: Interval resolution of the left-sided breast mass, correlating with
1 of the aspirated cysts.

Final Assessment: Post Procedure Mammograms for Marker Placement

RECOMMENDATION:
Recommend returning to annual screening mammography.

BI-RADS category 2-benign

## 2020-06-29 ENCOUNTER — Other Ambulatory Visit (INDEPENDENT_AMBULATORY_CARE_PROVIDER_SITE_OTHER): Payer: Self-pay | Admitting: Physician Assistant

## 2020-06-29 NOTE — Telephone Encounter (Signed)
[facility] called to get Vitamin D refill for pt. Please give pharmacy a call at [phone] regarding refill.

## 2020-06-29 NOTE — Telephone Encounter (Signed)
Pharmacy notified refill denied. Pt needs follow-up appt.  Billy Rocco LPN

## 2020-08-19 ENCOUNTER — Ambulatory Visit: Payer: BC Managed Care – PPO | Admitting: Orthopedic Surgery

## 2020-08-19 ENCOUNTER — Other Ambulatory Visit: Payer: Self-pay

## 2020-08-19 DIAGNOSIS — M25511 Pain in right shoulder: Secondary | ICD-10-CM | POA: Diagnosis not present

## 2020-08-19 DIAGNOSIS — M7750 Other enthesopathy of unspecified foot: Secondary | ICD-10-CM

## 2020-08-20 ENCOUNTER — Encounter: Payer: Self-pay | Admitting: Orthopedic Surgery

## 2020-08-20 NOTE — Progress Notes (Signed)
Office Visit Note   Patient: [name]           Date of Birth: 1961-03-30           MRN: [id number] Visit Date: 08/19/2020 Requested by: Anne Bern, MD [address] [place],  [place] [address] PCP: Anne Bern, MD  Subjective: Chief Complaint  Patient presents with  . Right Shoulder - Pain  . Left Ankle - Pain    HPI: [name] is a 59 year old patient with shoulder and ankle pain. Shoulders gotten some better. Slow process but some movement still hurt her. Regarding the foot she did have some medial sided midfoot pain but now she is having pain around the great toe region. That is been going on for the past 3 to 4 days. She wants to get back to lifting weights and becoming more active particularly with her shoulder. Denies any neck pain or radicular symptoms. Denies any scapular pain.              ROS: All systems reviewed are negative as they relate to the chief complaint within the history of present illness.  Patient denies  fevers or chills.   Assessment & Plan: Visit Diagnoses:  1. Right shoulder pain, unspecified chronicity   2. Tendinitis of foot     Plan: Impression is right shoulder pain which could be bursitis versus structural rotator cuff problem. Her strength is excellent. No evidence of frozen shoulder. Reassured her some below shoulder level strengthening exercises to do and she is going to continue those and try to get back to the activities that she wants to do. If that fails then subacromial injection is indicated plus or minus MRI scan. She wants to avoid that for now. Regarding the foot I think that she does have pes planus. Pain is migrated midfoot to the toe region. No hallux rigidus or arthritis at the MTP joint. Plan is over-the-counter arch supports which I think could help the foot in general. No structural problem today on exam.  Follow-Up Instructions: Return if symptoms worsen or fail to improve.   Orders:  No orders of the defined  types were placed in this encounter.  No orders of the defined types were placed in this encounter.     Procedures: No procedures performed   Clinical Data: No additional findings.  Objective: Vital Signs: There were no vitals taken for this visit.  Physical Exam:   Constitutional: Patient appears well-developed HEENT:  Head: Normocephalic Eyes:EOM are normal Neck: Normal range of motion Cardiovascular: Normal rate Pulmonary/chest: Effort normal Neurologic: Patient is alert Skin: Skin is warm Psychiatric: Patient has normal mood and affect    Ortho Exam: Ortho exam demonstrates pes planus bilaterally. Palpable intact nontender anterior to posterior to peroneal Achilles tendons. Less tenderness at the posterior tib insertion today on the left foot compared to last clinic visit. A little bit more MTP pain with no bunion deformity. Range of motion at the MTP joint great toe is excellent and symmetric. A little bit of sesamoid mediated type pain with mild tenderness to palpation in that region. Patient has symmetric tibiotalar subtalar transverse tarsal range of motion bilaterally.  Shoulder exam demonstrates full active and passive range of motion of the shoulder with no rotator cuff weakness infraspinatus subscap and supraspinatus testing. No discrete AC joint tenderness. Slight abduction and coarseness on the right compared to the left but that is only through a very limited range of motion. No restriction of external  rotation of 15 degrees of abduction. Impingement signs equivocal on the right negative on left.  Specialty Comments:  No specialty comments available.  Imaging: No results found.   PMFS History: Patient Active Problem List   Diagnosis Date Noted  . Hidradenitis 09/10/2019  . Menopausal flushing 03/16/2018  . Vitamin D deficiency 03/16/2018  . 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 Cystic acne 05/04/2016  . Hypertension 05/04/2016  . Plantar fasciitis of left foot  05/04/2016  . Varicose veins of both lower extremities 05/04/2016  . Anemia    Past Medical History:  Diagnosis Date  . Abscess of skin 2007  . Acne   . Anemia   . Bilateral swelling of feet and ankles   . Breast abscess 2007  . Breast mass   . Cellulitis 2003  . Constipation   . Fibroid   . Fibroids, submucosal 2008  . H/O: menorrhagia 2008  . Heart murmur   . Hx: UTI (urinary tract infection)   . Joint pain   . Obesity 2010    Family History  Problem Relation Age of Onset  . Hypertension Mother   . Diabetes Mother   . Diabetes Maternal Grandmother   . Hypertension Maternal Grandmother   . Diabetes Maternal Grandfather   . Hypertension Maternal Grandfather   . Cancer Father   . Depression Father     Past Surgical History:  Procedure Laterality Date  . ABDOMINAL HYSTERECTOMY    . TUBAL LIGATION     Social History   Occupational History  . Not on file  Tobacco Use  . Smoking status: Never Smoker  . Smokeless tobacco: Never Used  Substance and Sexual Activity  . Alcohol use: No  . Drug use: No  . Sexual activity: Yes    Birth control/protection: Surgical    Comment: hysterectomy

## 2020-12-07 ENCOUNTER — Other Ambulatory Visit: Payer: Self-pay | Admitting: Obstetrics and Gynecology

## 2021-06-22 ENCOUNTER — Ambulatory Visit: Payer: BC Managed Care – PPO | Admitting: Orthopedic Surgery

## 2021-07-01 ENCOUNTER — Encounter: Payer: Self-pay | Admitting: Orthopedic Surgery

## 2021-07-01 ENCOUNTER — Other Ambulatory Visit: Payer: Self-pay

## 2021-07-01 ENCOUNTER — Ambulatory Visit: Payer: BC Managed Care – PPO | Admitting: Physician Assistant

## 2021-07-01 ENCOUNTER — Ambulatory Visit: Payer: Self-pay

## 2021-07-01 DIAGNOSIS — M25572 Pain in left ankle and joints of left foot: Secondary | ICD-10-CM | POA: Diagnosis not present

## 2021-07-01 DIAGNOSIS — M25571 Pain in right ankle and joints of right foot: Secondary | ICD-10-CM

## 2021-07-01 DIAGNOSIS — M76822 Posterior tibial tendinitis, left leg: Secondary | ICD-10-CM

## 2021-07-01 NOTE — Progress Notes (Signed)
Office Visit Note   Patient: [name]           Date of Birth: 03/26/1961           MRN: [id number] Visit Date: 07/01/2021              Requested by: Delsa Bern, MD [address] [place],  [place] [address] PCP: Delsa Bern, MD  Chief Complaint  Patient presents with   Left Ankle - Pain      HPI: Patient is a pleasant 59 year old woman who presents with a 1 year plus history of left ankle pain and swelling.  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  She twisted her ankle at that time.  Since that time she has had some swelling that she treated with compression socks.  It improved but when she removes the sock the swelling returns.  She points to pain that occurs on the medial side of her ankle and just inferior to the lateral malleolus.  Assessment & Plan: Visit Diagnoses:  1. Pain in left ankle and joints of left foot   2. Pain in right ankle and joints of right foot   3. Posterior tibial tendonitis, left     Plan: I have recommended sole cork arch supports and or Hoka sneakers.  Also for now we will give her a posterior tibial tendon dysfunction brace.  We will try a course of physical therapy and she will follow-up once this is completed. Would recommend left foot x-rays at her next visit if she is not improved Follow-Up Instructions: No follow-ups on file.   Ortho Exam  Patient is alert, oriented, no adenopathy, well-dressed, normal affect, normal respiratory effort. Examination of her left ankle.  She does have a palpable dorsalis pedis pulse.  She has some swelling about the ankle but no cellulitis or signs of infection.  She does have planovalgus collapse.  She has good dorsiflexion strength good plantarflexion strength good eversion strength.  Inversion strength is less than compared to her unaffected ankle.  She is tender along the course of the posterior tibial tendon to its  insertion.  Also has lateral impingement findings.  Imaging: XR Ankle Complete Left  Result Date: 07/01/2021 X-rays of her left ankle demonstrates some generalized soft tissue swelling.  She has no acute osseous changes no soft tissue changes she does have some spurring at the calcaneus both posteriorly on the plantar surface  XR Ankle Complete Right  Result Date: 07/01/2021 3 views of her right ankle demonstrate overall well-maintained alignment.  No acute osseous changes.  She does have both posterior and plantar spurs on her calcaneus.  No images are attached to the encounter.  Labs: Lab Results  Component Value Date   HGBA1C 6.0 (H) 01/08/2020   HGBA1C 6.0 (H) 09/10/2019     Lab Results  Component Value Date   ALBUMIN 4.3 01/08/2020   ALBUMIN 4.3 09/10/2019    No results found for: MG Lab Results  Component Value Date   VD25OH 76.9 01/08/2020   VD25OH 38.5 09/10/2019    No results found for: PREALBUMIN CBC EXTENDED Latest Ref Rng & Units 09/10/2019 05/24/2012 04/09/2009  WBC 3.4 - 10.8 x10E3/uL 7.5 - 13.7(H)  RBC 3.77 - 5.28 x10E6/uL 4.40 - 3.21(L)  HGB 11.1 - 15.9 g/dL 12.1 12.7 9.3 DELTA CHECK NOTED(L)  HCT 34.0 - 46.6 % 37.2 - 27.5(L)  PLT  150 - 450 x10E3/uL 283 - 240  NEUTROABS 1.4 - 7.0 x10E3/uL 4.1 - -  LYMPHSABS 0.7 - 3.1 x10E3/uL 3.0 - -     There is no height or weight on file to calculate BMI.  Orders:  Orders Placed This Encounter  Procedures   XR Ankle Complete Left   XR Ankle Complete Right   Ambulatory referral to Physical Therapy   No orders of the defined types were placed in this encounter.    Procedures: No procedures performed  Clinical Data: No additional findings.  ROS:  All other systems negative, except as noted in the HPI. Review of Systems  Objective: Vital Signs: There were no vitals taken for this visit.  Specialty Comments:  No specialty comments available.  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/25/2016   Cystic acne 05/04/2016   Hypertension 05/04/2016   Plantar fasciitis of left foot 05/04/2016   Varicose veins of both lower extremities 05/04/2016   Anemia    Past Medical History:  Diagnosis Date   Abscess of skin 2007   Acne    Anemia    Bilateral swelling of feet and ankles    Breast abscess 2007   Breast mass    Cellulitis 2003   Constipation    Fibroid    Fibroids, submucosal 2008   H/O: menorrhagia 2008   Heart murmur    Hx: UTI (urinary tract infection)    Joint pain    Obesity 2010    Family History  Problem Relation Age of Onset   Hypertension Mother    Diabetes Mother    Diabetes Maternal Grandmother    Hypertension Maternal Grandmother    Diabetes Maternal Grandfather    Hypertension Maternal Grandfather    Cancer Father    Depression Father     Past Surgical History:  Procedure Laterality Date   ABDOMINAL HYSTERECTOMY     TUBAL LIGATION     Social History   Occupational History   Not on file  Tobacco Use   Smoking status: Never   Smokeless tobacco: Never  Substance and Sexual Activity   Alcohol use: No   Drug use: No   Sexual activity: Yes    Birth control/protection: Surgical    Comment: hysterectomy

## 2021-08-12 ENCOUNTER — Ambulatory Visit (INDEPENDENT_AMBULATORY_CARE_PROVIDER_SITE_OTHER): Payer: Self-pay | Admitting: Bariatrics

## 2022-05-25 ENCOUNTER — Encounter (INDEPENDENT_AMBULATORY_CARE_PROVIDER_SITE_OTHER): Payer: Self-pay

## 2022-12-13 ENCOUNTER — Other Ambulatory Visit: Payer: Self-pay | Admitting: Obstetrics and Gynecology

## 2022-12-13 DIAGNOSIS — N6320 Unspecified lump in the left breast, unspecified quadrant: Secondary | ICD-10-CM

## 2022-12-29 ENCOUNTER — Other Ambulatory Visit: Payer: BC Managed Care – PPO

## 2023-01-17 ENCOUNTER — Ambulatory Visit
Admission: RE | Admit: 2023-01-17 | Discharge: 2023-01-17 | Disposition: A | Discharge status: Home / Self Care | Payer: BC Managed Care – PPO | Source: Ambulatory Visit | Attending: Obstetrics and Gynecology | Admitting: Obstetrics and Gynecology

## 2023-01-17 DIAGNOSIS — N6320 Unspecified lump in the left breast, unspecified quadrant: Secondary | ICD-10-CM

## 2023-10-30 ENCOUNTER — Other Ambulatory Visit: Payer: Self-pay | Admitting: Obstetrics and Gynecology

## 2023-10-30 DIAGNOSIS — Z1231 Encounter for screening mammogram for malignant neoplasm of breast: Secondary | ICD-10-CM

## 2024-01-10 ENCOUNTER — Ambulatory Visit
Admission: RE | Admit: 2024-01-10 | Discharge: 2024-01-10 | Disposition: A | Discharge status: Home / Self Care | Payer: Self-pay | Source: Ambulatory Visit | Attending: Obstetrics and Gynecology | Admitting: Obstetrics and Gynecology

## 2024-01-10 DIAGNOSIS — Z1231 Encounter for screening mammogram for malignant neoplasm of breast: Secondary | ICD-10-CM
